# Patient Record
Sex: Male | Born: 1960
Health system: Southern US, Community
[De-identification: ages and names within clinical notes are randomized; demographics above are authoritative.]

## PROBLEM LIST (undated history)

## (undated) DIAGNOSIS — J449 Chronic obstructive pulmonary disease, unspecified: Secondary | ICD-10-CM

## (undated) DIAGNOSIS — I10 Essential (primary) hypertension: Secondary | ICD-10-CM

## (undated) DIAGNOSIS — E119 Type 2 diabetes mellitus without complications: Secondary | ICD-10-CM

## (undated) DIAGNOSIS — E785 Hyperlipidemia, unspecified: Secondary | ICD-10-CM

## (undated) DIAGNOSIS — Z720 Tobacco use: Secondary | ICD-10-CM

## (undated) DIAGNOSIS — I2510 Atherosclerotic heart disease of native coronary artery without angina pectoris: Secondary | ICD-10-CM

## (undated) HISTORY — PX: CHOLECYSTECTOMY: SHX55

## (undated) HISTORY — PX: HERNIA REPAIR: SHX51

## (undated) HISTORY — DX: Essential (primary) hypertension: I10

## (undated) HISTORY — DX: Hyperlipidemia, unspecified: E78.5

---

## 2015-08-08 ENCOUNTER — Emergency Department (HOSPITAL_COMMUNITY): Payer: Self-pay

## 2015-08-08 ENCOUNTER — Observation Stay (HOSPITAL_COMMUNITY)
Admission: EM | Admit: 2015-08-08 | Discharge: 2015-08-09 | Disposition: A | Discharge status: Home / Self Care | Payer: Self-pay | Attending: Internal Medicine | Admitting: Internal Medicine

## 2015-08-08 ENCOUNTER — Encounter (HOSPITAL_COMMUNITY): Payer: Self-pay

## 2015-08-08 DIAGNOSIS — J449 Chronic obstructive pulmonary disease, unspecified: Secondary | ICD-10-CM

## 2015-08-08 DIAGNOSIS — R1031 Right lower quadrant pain: Secondary | ICD-10-CM | POA: Diagnosis present

## 2015-08-08 DIAGNOSIS — E119 Type 2 diabetes mellitus without complications: Secondary | ICD-10-CM

## 2015-08-08 DIAGNOSIS — Z7982 Long term (current) use of aspirin: Secondary | ICD-10-CM | POA: Insufficient documentation

## 2015-08-08 DIAGNOSIS — R339 Retention of urine, unspecified: Secondary | ICD-10-CM | POA: Diagnosis present

## 2015-08-08 DIAGNOSIS — Z79899 Other long term (current) drug therapy: Secondary | ICD-10-CM | POA: Insufficient documentation

## 2015-08-08 DIAGNOSIS — K5900 Constipation, unspecified: Secondary | ICD-10-CM | POA: Diagnosis present

## 2015-08-08 DIAGNOSIS — K5909 Other constipation: Secondary | ICD-10-CM

## 2015-08-08 DIAGNOSIS — R55 Syncope and collapse: Secondary | ICD-10-CM

## 2015-08-08 DIAGNOSIS — R0789 Other chest pain: Principal | ICD-10-CM | POA: Insufficient documentation

## 2015-08-08 DIAGNOSIS — I25118 Atherosclerotic heart disease of native coronary artery with other forms of angina pectoris: Secondary | ICD-10-CM

## 2015-08-08 DIAGNOSIS — R079 Chest pain, unspecified: Secondary | ICD-10-CM

## 2015-08-08 DIAGNOSIS — I2510 Atherosclerotic heart disease of native coronary artery without angina pectoris: Secondary | ICD-10-CM | POA: Diagnosis present

## 2015-08-08 DIAGNOSIS — F17210 Nicotine dependence, cigarettes, uncomplicated: Secondary | ICD-10-CM | POA: Insufficient documentation

## 2015-08-08 DIAGNOSIS — Z7984 Long term (current) use of oral hypoglycemic drugs: Secondary | ICD-10-CM | POA: Insufficient documentation

## 2015-08-08 HISTORY — DX: Atherosclerotic heart disease of native coronary artery without angina pectoris: I25.10

## 2015-08-08 HISTORY — DX: Chronic obstructive pulmonary disease, unspecified: J44.9

## 2015-08-08 HISTORY — DX: Type 2 diabetes mellitus without complications: E11.9

## 2015-08-08 LAB — COMPREHENSIVE METABOLIC PANEL
ALBUMIN: 4.4 g/dL (ref 3.5–5.0)
ALT: 20 U/L (ref 17–63)
ANION GAP: 12 (ref 5–15)
AST: 23 U/L (ref 15–41)
Alkaline Phosphatase: 86 U/L (ref 38–126)
BUN: 14 mg/dL (ref 6–20)
CHLORIDE: 101 mmol/L (ref 101–111)
CO2: 25 mmol/L (ref 22–32)
Calcium: 9 mg/dL (ref 8.9–10.3)
Creatinine, Ser: 0.93 mg/dL (ref 0.61–1.24)
GFR calc Af Amer: >60 mL/min (ref >60)
GFR calc non Af Amer: >60 mL/min (ref >60)
GLUCOSE: 162 mg/dL — AB (ref 65–99)
POTASSIUM: 3 mmol/L — AB (ref 3.5–5.1)
SODIUM: 138 mmol/L (ref 135–145)
Total Bilirubin: 0.8 mg/dL (ref 0.3–1.2)
Total Protein: 8.2 g/dL — ABNORMAL HIGH (ref 6.5–8.1)

## 2015-08-08 LAB — CBC WITH DIFFERENTIAL/PLATELET
BASOS PCT: 0 %
Basophils Absolute: 0 10*3/uL (ref 0.0–0.1)
EOS ABS: 0.1 10*3/uL (ref 0.0–0.7)
EOS PCT: 0 %
HCT: 44 % (ref 39.0–52.0)
HEMOGLOBIN: 16 g/dL (ref 13.0–17.0)
LYMPHS ABS: 3.5 10*3/uL (ref 0.7–4.0)
Lymphocytes Relative: 19 %
MCH: 31.4 pg (ref 26.0–34.0)
MCHC: 36.4 g/dL — AB (ref 30.0–36.0)
MCV: 86.4 fL (ref 78.0–100.0)
MONOS PCT: 5 %
Monocytes Absolute: 0.9 10*3/uL (ref 0.1–1.0)
NEUTROS PCT: 76 %
Neutro Abs: 14.2 10*3/uL — ABNORMAL HIGH (ref 1.7–7.7)
PLATELETS: 292 10*3/uL (ref 150–400)
RBC: 5.09 MIL/uL (ref 4.22–5.81)
RDW: 13.4 % (ref 11.5–15.5)
WBC: 18.7 10*3/uL — AB (ref 4.0–10.5)

## 2015-08-08 LAB — I-STAT TROPONIN, ED: TROPONIN I, POC: 0 ng/mL (ref 0.00–0.08)

## 2015-08-08 NOTE — ED Provider Notes (Signed)
By signing my name below, I, Marisue Humble, attest that this documentation has been prepared under the direction and in the presence of Hildagarde Holleran N Selim Durden, DO . Electronically Signed: Marisue Humble, Scribe. 08/09/2015. 12:12 AM.  TIME SEEN: 12:05 AM  CHIEF COMPLAINT: Chest pain  HPI: HPI Comments:  Cesar Morales is a 55 y.o. male with PMHx of CAD, DM, and COPD who presents to the Emergency Department complaining of tight chest pain with syncopal episode onset ~900 yesterday morning at work. He states his chest is still tight but is moderately alleviated after taking nitroglycerin. Pt states he has had chest pain ~1-2 times per week for the past 5 years so he didn't come in immediately. His last episode of chest pain was three days ago; last syncopal episode was 6 months.  States syncopal event occurred prior to taking nitroglycerin. States and diaphoresis with these episodes.  He usually takes Imdur 30 and baby aspirin every morning for chest pain.   Pt reports associated groin pain radiating to rectum, lower abdominal pain and constipation. States that he thought that this will not was causing his chest pain this afternoon/evening. He notes his last bowel movement was 4 days ago. Pt states he drank a bottle of magnesium citrate and had suppository today with no relief. Pt reports umbilical hernia repair and cholecystectomy. Pt denies nausea, vomiting, or diarrhea. No sick contacts or recent travel. Pain is described as severe.   ROS: See HPI Constitutional: no fever  Eyes: no drainage  ENT: no runny nose   Cardiovascular:  chest pain  Resp: SOB  GI: no vomiting GU: no dysuria Integumentary: no rash  Allergy: no hives  Musculoskeletal: no leg swelling  Neurological: no slurred speech ROS otherwise negative  PAST MEDICAL HISTORY/PAST SURGICAL HISTORY:  Past Medical History  Diagnosis Date  . Coronary artery disease   . Diabetes mellitus without complication (HCC)   . COPD (chronic  obstructive pulmonary disease) (HCC)     MEDICATIONS:  Prior to Admission medications   Medication Sig Start Date End Date Taking? Authorizing Provider  aspirin EC 81 MG tablet Take 81 mg by mouth daily.   Yes Historical Provider, MD  atorvastatin (LIPITOR) 10 MG tablet Take 10 mg by mouth daily.   Yes Historical Provider, MD  chlorthalidone (HYGROTON) 25 MG tablet Take 25 mg by mouth daily.   Yes Historical Provider, MD  gabapentin (NEURONTIN) 100 MG capsule Take 100 mg by mouth at bedtime.   Yes Historical Provider, MD  glipiZIDE (GLUCOTROL) 5 MG tablet Take by mouth daily before breakfast.   Yes Historical Provider, MD  isosorbide mononitrate (IMDUR) 30 MG 24 hr tablet Take 30 mg by mouth daily.   Yes Historical Provider, MD  metFORMIN (GLUCOPHAGE) 500 MG tablet Take by mouth at bedtime.   Yes Historical Provider, MD    ALLERGIES:  Allergies  Allergen Reactions  . Lisinopril Swelling    SOCIAL HISTORY:  Social History  Substance Use Topics  . Smoking status: Current Every Day Smoker  . Smokeless tobacco: Not on file  . Alcohol Use: No    FAMILY HISTORY: No family history on file.  EXAM: BP 136/80 mmHg  Pulse 72  Temp(Src) 97.7 F (36.5 C) (Oral)  Resp 13  Ht 6' (1.829 m)  Wt 272 lb (123.378 kg)  BMI 36.88 kg/m2  SpO2 95% CONSTITUTIONAL: Alert and oriented and responds appropriately to questions. Chronically ill appearing. Appears uncomfortable.  HEAD: Normocephalic EYES: Conjunctivae clear, PERRL ENT: normal nose;  no rhinorrhea; moist mucous membranes NECK: Supple, no meningismus, no LAD  CARD: RRR; S1 and S2 appreciated; no murmurs, no clicks, no rubs, no gallops RESP: Normal chest excursion without splinting or tachypnea; breath sounds clear and equal bilaterally; no wheezes, no rhonchi, no rales, no hypoxia or respiratory distress, speaking full sentences ABD/GI: Normal bowel sounds; non-distended; soft, TTP suprapubic area, tenderness at McBurney's pt and  involuntary guarding; no rebound, no peritoneal signs;  RECTAL:  Normal rectal tone, no gross blood or melena, no hemorrhoids appreciated, nontender rectal exam; large amount of hard stool in the rectal vault, patient unable to tolerate disimpaction BACK:  The back appears normal and is non-tender to palpation, there is no CVA tenderness EXT: Normal ROM in all joints; non-tender to palpation; no edema; normal capillary refill; no cyanosis, no calf tenderness or swelling    SKIN: Normal color for age and race; warm; no rash NEURO: Moves all extremities equally, sensation to light touch intact diffusely, cranial nerves II through XII intact PSYCH: The patient's mood and manner are appropriate. Grooming and personal hygiene are appropriate.  MEDICAL DECISION MAKING: Patient here with complaints of chest pain and syncopal event. Reports recent cardiac catheterization in October 2016 in [place]. We'll attempt to obtain these records. We'll give aspirin, nitroglycerin. Will obtain cardiac labs, chest x-ray. Concern for ACS. PE and dissection also on differential but less likely.   As for patient's abdominal pain, concern for possible appendicitis, small bowel obstruction, colitis. We'll obtain CT of his abdomen and pelvis. Will obtain abdominal labs, urine. Will give IV fluids, morphine.  ED PROGRESS: Patient's labs show leukocytosis of 18.7 with left shift. Potassium slightly low at 3.0 which we have replaced. Troponin is negative. Urine shows no sign of infection. Chest x-ray clear.    2:00 AM  Pt's chest pain is now gone. Still having lower abdominal pain. We'll give second dose of IV morphine. Reports he was able to have a large bowel movement after Fleet enema but this did not improve his pain.  CTAP pending.     2:30 AM  D/w Dr. Onalee Hua with hospitalist service.  She agrees with admission for chest pain rule out, syncope workup. Will admit to telemetry, observation. Patient comfortable  with this plan.    Pt's outside hospital records show pain coronary arteries with normal left ventricular systolic function and normal left ventricular end-diastolic pressures. Heart catheterization was performed September 2016. They did not send an EKG or echocardiogram.   3:15 AM  Pt had another brief episode of chest pain. Repeat EKG is unremarkable. Chest pain-free after nitroglycerin.   EKG Interpretation  Date/Time:  Wednesday August 08 2015 23:02:34 EDT Ventricular Rate:  82 PR Interval:  174 QRS Duration: 111 QT Interval:  398 QTC Calculation: 465 R Axis:   -21 Text Interpretation:  Sinus rhythm Borderline left axis deviation Borderline T wave abnormalities Baseline wander in lead(s) V3 V4 No old tracing to compare Confirmed by Deserea Bordley,  DO, Hanaa Payes [phone]) on 08/08/2015 11:52:27 PM          EKG Interpretation  Date/Time:  Thursday August 09 2015 03:12:49 EDT Ventricular Rate:  73 PR Interval:  182 QRS Duration: 100 QT Interval:  424 QTC Calculation: 467 R Axis:   -32 Text Interpretation:  Sinus rhythm Left axis deviation Abnormal R-wave progression, late transition Borderline T wave abnormalities No significant change since last tracing Confirmed by Tanasia Budzinski,  DO, Exavier Lina ([id number]) on 08/09/2015 3:31:47 AM  I personally performed the services described in this documentation, which was scribed in my presence. The recorded information has been reviewed and is accurate.    Layla Maw Armida Vickroy, DO 08/09/15 [phone]

## 2015-08-08 NOTE — ED Notes (Addendum)
Pt states he frequently has chest pain and takes nitroglycerin that relieves the pain. Reports chest pain started this morning at work, where he "passed out", denies hitting his head. Pt took nitro with relief this AM and CP started again today, Nitro did not relieve it. States pain to groin that radiates into rectum. Has not had BM in 1 week. Reports he was unable to urinate since last night, pt has since voided of amber urine.

## 2015-08-08 NOTE — ED Notes (Signed)
Pt reports chest tightness that started earlier in the day, states he has also been sob.  Pt states he also recently has pain to his lower abd/groin.  Pt took own nitro approx 2 hours ago.

## 2015-08-09 ENCOUNTER — Emergency Department (HOSPITAL_COMMUNITY): Payer: Self-pay

## 2015-08-09 ENCOUNTER — Observation Stay (HOSPITAL_BASED_OUTPATIENT_CLINIC_OR_DEPARTMENT_OTHER): Payer: MEDICAID

## 2015-08-09 ENCOUNTER — Observation Stay (HOSPITAL_COMMUNITY): Payer: Self-pay

## 2015-08-09 ENCOUNTER — Encounter (HOSPITAL_COMMUNITY): Payer: Self-pay

## 2015-08-09 DIAGNOSIS — R079 Chest pain, unspecified: Secondary | ICD-10-CM | POA: Diagnosis present

## 2015-08-09 DIAGNOSIS — J449 Chronic obstructive pulmonary disease, unspecified: Secondary | ICD-10-CM | POA: Diagnosis present

## 2015-08-09 DIAGNOSIS — R339 Retention of urine, unspecified: Secondary | ICD-10-CM | POA: Diagnosis present

## 2015-08-09 DIAGNOSIS — K5900 Constipation, unspecified: Secondary | ICD-10-CM | POA: Diagnosis present

## 2015-08-09 DIAGNOSIS — I2510 Atherosclerotic heart disease of native coronary artery without angina pectoris: Secondary | ICD-10-CM | POA: Diagnosis present

## 2015-08-09 DIAGNOSIS — R55 Syncope and collapse: Secondary | ICD-10-CM | POA: Diagnosis present

## 2015-08-09 DIAGNOSIS — R1031 Right lower quadrant pain: Secondary | ICD-10-CM | POA: Diagnosis present

## 2015-08-09 DIAGNOSIS — E119 Type 2 diabetes mellitus without complications: Secondary | ICD-10-CM

## 2015-08-09 LAB — URINALYSIS, ROUTINE W REFLEX MICROSCOPIC
GLUCOSE, UA: NEGATIVE mg/dL
HGB URINE DIPSTICK: NEGATIVE
Ketones, ur: NEGATIVE mg/dL
Leukocytes, UA: NEGATIVE
Nitrite: NEGATIVE
PH: 6.5 (ref 5.0–8.0)
Protein, ur: NEGATIVE mg/dL
SPECIFIC GRAVITY, URINE: 1.015 (ref 1.005–1.030)

## 2015-08-09 LAB — GLUCOSE, CAPILLARY
GLUCOSE-CAPILLARY: 135 mg/dL — AB (ref 65–99)
GLUCOSE-CAPILLARY: 164 mg/dL — AB (ref 65–99)
Glucose-Capillary: 124 mg/dL — ABNORMAL HIGH (ref 65–99)

## 2015-08-09 LAB — TROPONIN I
Troponin I: <0.03 ng/mL (ref <0.031)
Troponin I: <0.03 ng/mL (ref <0.031)
Troponin I: <0.03 ng/mL (ref <0.031)

## 2015-08-09 LAB — BRAIN NATRIURETIC PEPTIDE: B Natriuretic Peptide: 25 pg/mL (ref 0.0–100.0)

## 2015-08-09 MED ORDER — PANTOPRAZOLE SODIUM 40 MG PO TBEC: 40.0000 mg | DELAYED_RELEASE_TABLET | Freq: Every day | ORAL | Status: DC

## 2015-08-09 MED ORDER — ASPIRIN 81 MG PO CHEW
324.0000 mg | CHEWABLE_TABLET | Freq: Once | ORAL | Status: AC
  Administered 2015-08-09: 324 mg via ORAL
  Filled 2015-08-09: qty 4

## 2015-08-09 MED ORDER — SODIUM CHLORIDE 0.9 % IV SOLN
INTRAVENOUS | Status: DC
  Administered 2015-08-09: 01:00:00 via INTRAVENOUS

## 2015-08-09 MED ORDER — POTASSIUM CHLORIDE CRYS ER 20 MEQ PO TBCR
40.0000 meq | EXTENDED_RELEASE_TABLET | Freq: Once | ORAL | Status: AC
  Administered 2015-08-09: 40 meq via ORAL
  Filled 2015-08-09: qty 2

## 2015-08-09 MED ORDER — ISOSORBIDE MONONITRATE ER 60 MG PO TB24
30.0000 mg | ORAL_TABLET | Freq: Every day | ORAL | Status: DC
  Administered 2015-08-09: 30 mg via ORAL
  Filled 2015-08-09: qty 1

## 2015-08-09 MED ORDER — ACETAMINOPHEN 325 MG PO TABS: 650.0000 mg | ORAL_TABLET | ORAL | Status: DC | PRN

## 2015-08-09 MED ORDER — IOHEXOL 300 MG/ML  SOLN
100.0000 mL | Freq: Once | INTRAMUSCULAR | Status: AC | PRN
  Administered 2015-08-09: 100 mL via INTRAVENOUS

## 2015-08-09 MED ORDER — FLEET ENEMA 7-19 GM/118ML RE ENEM
1.0000 | ENEMA | Freq: Once | RECTAL | Status: AC
  Administered 2015-08-09: 1 via RECTAL

## 2015-08-09 MED ORDER — TRAZODONE HCL 100 MG PO TABS: 100.0000 mg | ORAL_TABLET | Freq: Every day | ORAL | Status: DC

## 2015-08-09 MED ORDER — ATORVASTATIN CALCIUM 10 MG PO TABS
10.0000 mg | ORAL_TABLET | Freq: Every day | ORAL | Status: DC
  Administered 2015-08-09: 10 mg via ORAL
  Filled 2015-08-09: qty 1

## 2015-08-09 MED ORDER — MORPHINE SULFATE (PF) 4 MG/ML IV SOLN
4.0000 mg | Freq: Once | INTRAVENOUS | Status: AC
  Administered 2015-08-09: 4 mg via INTRAVENOUS
  Filled 2015-08-09: qty 1

## 2015-08-09 MED ORDER — ASPIRIN EC 81 MG PO TBEC
81.0000 mg | DELAYED_RELEASE_TABLET | Freq: Every day | ORAL | Status: DC
  Administered 2015-08-09: 81 mg via ORAL
  Filled 2015-08-09: qty 1

## 2015-08-09 MED ORDER — TAMSULOSIN HCL 0.4 MG PO CAPS
0.4000 mg | ORAL_CAPSULE | Freq: Every day | ORAL | Status: DC
  Administered 2015-08-09: 0.4 mg via ORAL
  Filled 2015-08-09: qty 1

## 2015-08-09 MED ORDER — ONDANSETRON HCL 4 MG/2ML IJ SOLN
4.0000 mg | Freq: Once | INTRAMUSCULAR | Status: AC
  Administered 2015-08-09: 4 mg via INTRAVENOUS
  Filled 2015-08-09: qty 2

## 2015-08-09 MED ORDER — TAMSULOSIN HCL 0.4 MG PO CAPS: 0.4000 mg | ORAL_CAPSULE | Freq: Every day | ORAL | Status: DC

## 2015-08-09 MED ORDER — MORPHINE SULFATE (PF) 2 MG/ML IV SOLN
2.0000 mg | INTRAVENOUS | Status: DC | PRN
Start: 2015-08-09 — End: 2015-08-09

## 2015-08-09 MED ORDER — ONDANSETRON HCL 4 MG/2ML IJ SOLN: 4.0000 mg | Freq: Four times a day (QID) | INTRAMUSCULAR | Status: DC | PRN

## 2015-08-09 MED ORDER — NITROGLYCERIN 0.4 MG SL SUBL
0.4000 mg | SUBLINGUAL_TABLET | SUBLINGUAL | Status: DC | PRN
Start: 2015-08-09 — End: 2015-08-09
  Administered 2015-08-09 (×2): 0.4 mg via SUBLINGUAL
  Filled 2015-08-09 (×2): qty 1

## 2015-08-09 NOTE — Discharge Summary (Signed)
Physician Discharge Summary  Cesar Morales KGY:[id number] DOB: 1961-05-15 DOA: 08/08/2015  PCP: No primary care provider on file.  Admit date: 08/08/2015 Discharge date: 08/09/2015  Time spent: 45 minutes  Recommendations for Outpatient Follow-up:  -Will be discharged home today. -Follow up with PCP has been arranged by case management.   Discharge Diagnoses:  Principal Problem:   Chest pain Active Problems:   COPD (chronic obstructive pulmonary disease) (HCC)   Diabetes mellitus without complication (HCC)   Coronary artery disease   Abdominal pain, acute, right lower quadrant   Syncope   Urinary retention   Constipation   Chronic obstructive pulmonary disease (HCC)   Faintness   Discharge Condition: Stable and improved  Filed Weights   08/08/15 2305 08/09/15 0534  Weight: 123.378 kg (272 lb) 116.484 kg (256 lb 12.8 oz)    History of present illness:  As per Dr. 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 Pt reports he has been having 3 days of constipation (unusual for him ) and 24 hours of difficulty in urinating. Pt says he was at work and started to have this pressure like sensation in his suprapubic region which was very uncomfortable he started to get dizzy. He went to sit on a 5 gallon bucket to lean over due to the pain, and he passed out. His friend at work laid him down, and when he awoke he was having some substernal chest pain, for which he takes ntg pills for . His friend then gave him a ntg pill which relieved his chest pain. Pt abdominal pain went away. He went home. Later in the evening, the same thing happened, he started with lower abdominal /suprapubic abdomional pain then chest pain but did not pass out this time. Pt denies any dysuria but difficulty in urination. Has never had an issue with urinating before or with constipation. Denies fevers. He was  given an enema in the ED which resulting in several bowel movements and pt reports he feels much better. Pt had a cardiac cath at outside facility in [place] end of 2016 reported to me by dr ward as no significant CAD. Pt reports he was told after that cath that he had "small arteries" and it was "hereditary" and that he has "spasms". Pt denies any penile discharge or rashes. No bulging in his inguinal area.  Hospital Course:   Chest pain -Has ruled out for ACS with negative enzymes and EKG without acute ischemic abnormalities. -Was able to track down cath report from [facility] dated September 2016. He had clean coronary arteries at that time and an ejection fraction of 60%. -Given negative calf less than 6 months ago, feel safe discharging him home today without any further cardiac workup. -2-D echo has been performed today with results pending at time of discharge. -He does have a history of GERD and has been taking ranitidine, will change over to Protonix.  Rest of chronic conditions are stable.  Procedures:  None   Consultations:  None  Discharge Instructions  Discharge Instructions    Diet - low sodium heart healthy    Complete by:  As directed      Increase activity slowly    Complete by:  As directed             Medication List    STOP taking these medications  ranitidine 150 MG tablet  Commonly known as:  ZANTAC      TAKE these medications        albuterol 108 (90 Base) MCG/ACT inhaler  Commonly known as:  PROVENTIL HFA;VENTOLIN HFA  Inhale 2 puffs into the lungs every 6 (six) hours as needed for wheezing or shortness of breath.     aspirin EC 81 MG tablet  Take 81 mg by mouth daily.     atorvastatin 10 MG tablet  Commonly known as:  LIPITOR  Take 10 mg by mouth daily.     chlorthalidone 25 MG tablet  Commonly known as:  HYGROTON  Take 25 mg by mouth daily.     gabapentin 100 MG capsule  Commonly known as:  NEURONTIN  Take 100 mg by  mouth at bedtime.     glipiZIDE 5 MG tablet  Commonly known as:  GLUCOTROL  Take by mouth daily before breakfast.     ibuprofen 200 MG tablet  Commonly known as:  ADVIL,MOTRIN  Take 600 mg by mouth daily as needed for moderate pain.     isosorbide mononitrate 30 MG 24 hr tablet  Commonly known as:  IMDUR  Take 30 mg by mouth daily.     metFORMIN 500 MG tablet  Commonly known as:  GLUCOPHAGE  Take 500 mg by mouth 2 (two) times daily with a meal.     naproxen 500 MG tablet  Commonly known as:  NAPROSYN  Take 500 mg by mouth daily as needed for moderate pain.     pantoprazole 40 MG tablet  Commonly known as:  PROTONIX  Take 1 tablet (40 mg total) by mouth daily.     tamsulosin 0.4 MG Caps capsule  Commonly known as:  FLOMAX  Take 1 capsule (0.4 mg total) by mouth daily.     traZODone 100 MG tablet  Commonly known as:  DESYREL  Take 1 tablet (100 mg total) by mouth at bedtime.       Allergies  Allergen Reactions  . Lisinopril Swelling       Follow-up Information    Follow up with Rondel Baton On 08/21/2015.   Why:  @ 9am bring application and listed documents   Contact information:   [address] [place] [address] [phone]        The results of significant diagnostics from this hospitalization (including imaging, microbiology, ancillary and laboratory) are listed below for reference.    Significant Diagnostic Studies: Ct Abdomen Pelvis W Contrast  08/09/2015  CLINICAL DATA:  55 year old male with right lower quadrant abdominal pain, constipation, and leukocytosis. EXAM: CT ABDOMEN AND PELVIS WITH CONTRAST TECHNIQUE: Multidetector CT imaging of the abdomen and pelvis was performed using the standard protocol following bolus administration of intravenous contrast. CONTRAST:  OMNIPAQUE IOHEXOL 300 MG/ML  SOLN COMPARISON:  None. FINDINGS: The visualized lung bases are clear. No intra-abdominal free air or free fluid. Cholecystectomy. The  liver, pancreas, spleen, adrenal glands, kidneys, visualized ureters, and urinary bladder appear unremarkable. The prostate and seminal vesicles are grossly unremarkable. Loose stool noted throughout the colon. There is no evidence of bowel obstruction or inflammation. Normal appendix. The abdominal aorta and IVC appear unremarkable. Retrograde left renal vein. No portal venous gas identified. There is no adenopathy. The abdominal wall soft tissues appear unremarkable. A subcentimeter faint sclerotic nodule in the left iliac bone posteriorly is indeterminate but may represent a bone island. IMPRESSION: Loose stool within the proximal colon. Correlation with  clinical exam and stool cultures recommended. No evidence of bowel obstruction or inflammation. Normal appendix. Electronically Signed   By: Elgie Collard M.D.   On: 08/09/2015 01:33   Dg Chest Portable 1 View  08/08/2015  CLINICAL DATA:  Central chest pain and tightness beginning several hours ago. Chest pain for 3 years but this is more severe. Smoker. EXAM: PORTABLE CHEST 1 VIEW COMPARISON:  None. FINDINGS: The heart size and mediastinal contours are within normal limits. Both lungs are clear. The visualized skeletal structures are unremarkable. IMPRESSION: No active disease. Electronically Signed   By: Burman Nieves M.D.   On: 08/08/2015 23:43    Microbiology: No results found for this or any previous visit (from the past 240 hour(s)).   Labs: Basic Metabolic Panel:  Recent Labs Lab 08/08/15 2302  NA 138  K 3.0*  CL 101  CO2 25  GLUCOSE 162*  BUN 14  CREATININE 0.93  CALCIUM 9.0   Liver Function Tests:  Recent Labs Lab 08/08/15 2302  AST 23  ALT 20  ALKPHOS 86  BILITOT 0.8  PROT 8.2*  ALBUMIN 4.4   No results for input(s): LIPASE, AMYLASE in the last 168 hours. No results for input(s): AMMONIA in the last 168 hours. CBC:  Recent Labs Lab 08/08/15 2302  WBC 18.7*  NEUTROABS 14.2*  HGB 16.0  HCT 44.0  MCV  86.4  PLT 292   Cardiac Enzymes:  Recent Labs Lab 08/09/15 0448 08/09/15 1045 08/09/15 1618  TROPONINI <0.03 <0.03 <0.03   BNP: BNP (last 3 results)  Recent Labs  08/08/15 2302  BNP 25.0    ProBNP (last 3 results) No results for input(s): PROBNP in the last 8760 hours.  CBG:  Recent Labs Lab 08/09/15 0743 08/09/15 1122 08/09/15 1616  GLUCAP 124* 135* 164*       Signed:  HERNANDEZ ACOSTA,ESTELA  [facility] Pager: [phone] 08/09/2015, 5:15 PM

## 2015-08-09 NOTE — H&P (Signed)
PCP:   No primary care provider on file.   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  Pt reports he has been having 3 days of constipation (unusual for him ) and 24 hours of difficulty in urinating.  Pt says he was at work and started to have this pressure like sensation in his suprapubic region which was very uncomfortable he started to get dizzy.  He went to sit on a 5 gallon bucket to lean over due to the pain, and he passed out.  His friend at work laid him down, and when he awoke he was having some substernal chest pain, for which he takes ntg pills for .  His friend then gave him a ntg pill which relieved his chest pain.  Pt abdominal pain went away.  He went home.  Later in the evening, the same thing happened, he started with lower abdominal /suprapubic abdomional pain then chest pain but did not pass out this time.  Pt denies any dysuria but difficulty in urination. Has never had an issue with urinating before or with constipation.  Denies fevers.  He was given an enema in the ED which resulting in several bowel movements and pt reports he feels much better.  Pt had a cardiac cath at outside facility in [place] end of 2016 reported to me by dr ward as no significant CAD.  Pt reports he was told after that cath that he had "small arteries" and it was "hereditary" and that he has "spasms".  Pt denies any penile discharge or rashes.  No bulging in his inguinal area.   Review of Systems:  Positive and negative as per HPI otherwise all other systems are negative  Past Medical History: Past Medical History  Diagnosis Date  . Coronary artery disease   . Diabetes mellitus without complication (HCC)   . COPD (chronic obstructive pulmonary disease) [facility])    Past Surgical History  Procedure Laterality Date  . Cholecystectomy    .  Hernia repair      Medications: Prior to Admission medications   Medication Sig Start Date End Date Taking? Authorizing Provider  aspirin EC 81 MG tablet Take 81 mg by mouth daily.   Yes Historical Provider, MD  atorvastatin (LIPITOR) 10 MG tablet Take 10 mg by mouth daily.   Yes Historical Provider, MD  chlorthalidone (HYGROTON) 25 MG tablet Take 25 mg by mouth daily.   Yes Historical Provider, MD  gabapentin (NEURONTIN) 100 MG capsule Take 100 mg by mouth at bedtime.   Yes Historical Provider, MD  glipiZIDE (GLUCOTROL) 5 MG tablet Take by mouth daily before breakfast.   Yes Historical Provider, MD  isosorbide mononitrate (IMDUR) 30 MG 24 hr tablet Take 30 mg by mouth daily.   Yes Historical Provider, MD  metFORMIN (GLUCOPHAGE) 500 MG tablet Take by mouth at bedtime.   Yes Historical Provider, MD    Allergies:   Allergies  Allergen Reactions  . Lisinopril Swelling    Social History:  reports that he has been smoking.  He does not have any smokeless tobacco history on file. He reports that he does not drink alcohol or use illicit drugs.  Family History: No premature CAD  Physical Exam: Filed Vitals:   08/09/15 0230 08/09/15 0300 08/09/15 0330 08/09/15 0400  BP: 127/83 131/71 118/72 129/77  Pulse: 63  63 62 61  Temp:      TempSrc:      Resp: 14 16 13 13   Height:      Weight:      SpO2: 97% 98% 95% 97%   General appearance: alert, cooperative and no distress Head: Normocephalic, without obvious abnormality, atraumatic Eyes: negative Nose: Nares normal. Septum midline. Mucosa normal. No drainage or sinus tenderness. Neck: no JVD and supple, symmetrical, trachea midline Lungs: clear to auscultation bilaterally Heart: regular rate and rhythm, S1, S2 normal, no murmur, click, rub or gallop Abdomen: soft, ttp blq and suprapubic area, good bs.  no r/g ND Male genitalia: normal  No hernias.  External exam is normal.  No discharge. Extremities: extremities normal, atraumatic, no  cyanosis or edema Pulses: 2+ and symmetric Skin: Skin color, texture, turgor normal. No rashes or lesions Neurologic: Grossly normal  Labs on Admission:   Recent Labs  08/08/15 2302  NA 138  K 3.0*  CL 101  CO2 25  GLUCOSE 162*  BUN 14  CREATININE 0.93  CALCIUM 9.0    Recent Labs  08/08/15 2302  AST 23  ALT 20  ALKPHOS 86  BILITOT 0.8  PROT 8.2*  ALBUMIN 4.4     Recent Labs  08/08/15 2302  WBC 18.7*  NEUTROABS 14.2*  HGB 16.0  HCT 44.0  MCV 86.4  PLT 292    Radiological Exams on Admission: Ct Abdomen Pelvis W Contrast  08/09/2015  CLINICAL DATA:  55 year old male with right lower quadrant abdominal pain, constipation, and leukocytosis. EXAM: CT ABDOMEN AND PELVIS WITH CONTRAST TECHNIQUE: Multidetector CT imaging of the abdomen and pelvis was performed using the standard protocol following bolus administration of intravenous contrast. CONTRAST:  44 OMNIPAQUE IOHEXOL 300 MG/ML  SOLN COMPARISON:  None. FINDINGS: The visualized lung bases are clear. No intra-abdominal free air or free fluid. Cholecystectomy. The liver, pancreas, spleen, adrenal glands, kidneys, visualized ureters, and urinary bladder appear unremarkable. The prostate and seminal vesicles are grossly unremarkable. Loose stool noted throughout the colon. There is no evidence of bowel obstruction or inflammation. Normal appendix. The abdominal aorta and IVC appear unremarkable. Retrograde left renal vein. No portal venous gas identified. There is no adenopathy. The abdominal wall soft tissues appear unremarkable. A subcentimeter faint sclerotic nodule in the left iliac bone posteriorly is indeterminate but may represent a bone island. IMPRESSION: Loose stool within the proximal colon. Correlation with clinical exam and stool cultures recommended. No evidence of bowel obstruction or inflammation. Normal appendix. Electronically Signed   By: M.D.   On: 08/09/2015 01:33   Dg Chest Portable 1  View  08/08/2015  CLINICAL DATA:  Central chest pain and tightness beginning several hours ago. Chest pain for 3 years but this is more severe. Smoker. EXAM: PORTABLE CHEST 1 VIEW COMPARISON:  None. FINDINGS: The heart size and mediastinal contours are within normal limits. Both lungs are clear. The visualized skeletal structures are unremarkable. IMPRESSION: No active disease. Electronically Signed   By: 08/10/2015 M.D.   On: 08/08/2015 23:43    Assessment/Plan  55 yo male with abdominal pain, constipation, urinary retention, and chest pain  Principal Problem:   Chest pain- sounds like prinzmetal angina.  Have asked RN on floor to get cath report which was obtained by ED, reported as being pretty normal.  This was probably brought on by his GI/GU symptoms.  Romi.  Check echo in am.  Address below issues.  Active Problems:   Abdominal pain,  acute, lower abdomen- unclear if this is from urinary retention/or constipation or both.  abd exam is benign.     Urinary retention - ua is clean.  Will check bladder scan at this time.  Could be brought on by his constipation.  Will place on flomax in the am, but see if this is relieved with relieving his constipation   Syncope- likely vasovagal response to his GU /GI issues   COPD (chronic obstructive pulmonary disease) (HCC)- stable, noted   Diabetes mellitus without complication (HCC)- stable, noted   Constipation - make sure pt has had appropriate screening colonoscopy , if not will need one as outpatient  Pt has no local PCP. Just moved here from [place].  obs on tele.  Full code.    Carden Teel A 08/09/2015, 5:19 AM

## 2015-08-09 NOTE — Care Management Note (Signed)
Case Management Note  Patient Details  Name: Cesar Morales MRN: [id number] Date of Birth: February 26, 1961  Pt is from home, lives alone and is ind with ADL's. Pt is uninsured, unemployed and has no PCP. Pt plans to return home with self care at DC. Pt referred to [facility], his f/u of choice, and given MATCH voucher for any Rx. FC has been consulted and is at bedside now. Pt's ex-wife is at the bedside to provide support. Potential for DC today.   Expected Discharge Date:    08/09/2015              Expected Discharge Plan:  Home/Self Care  In-House Referral:  Financial Counselor  Discharge planning Services  CM Consult, Follow-up appt scheduled, MATCH Program, Medication Assistance  Post Acute Care Choice:  NA Choice offered to:  NA  DME Arranged:    DME Agency:     HH Arranged:    HH Agency:     Status of Service:  Completed, signed off  Medicare Important Message Given:    Date Medicare IM Given:    Medicare IM give by:    Date Additional Medicare IM Given:    Additional Medicare Important Message give by:     If discussed at Long Length of Stay Meetings, dates discussed:    Additional Comments:  Malcolm Metro, RN 08/09/2015, 3:26 PM

## 2015-08-09 NOTE — Progress Notes (Signed)
Discharge instructions read to patient and his family.  Both verbalized understanding of all instructions.  Discharged to home with family 

## 2015-08-09 NOTE — Plan of Care (Signed)
Problem: Phase I Progression Outcomes Goal: Anginal pain relieved Outcome: Completed/Met Date Met:  08/09/15 denies

## 2015-08-09 NOTE — ED Notes (Signed)
Pt's wife, Velna Hatchet [phone]

## 2015-08-10 LAB — ECHOCARDIOGRAM COMPLETE
HEIGHTINCHES: 72 in
Weight: 4108.8 oz

## 2015-12-27 ENCOUNTER — Telehealth: Payer: Self-pay

## 2015-12-27 NOTE — Telephone Encounter (Signed)
Patients wife contacted office asking to make a new patient appt for her husband. She states that he has been having chest pain and vomiting at times. Advised wife that patient did not need to come here for those symptoms but needed to go to the ER immediately. Wife states that he has had these symptoms for quite some time and refuses to go to ER. Only wants a Drs appt. Gave appt for new patient per wife request but again advised that he should not wait on this appt and if symptoms worsen he should to to ER Asap. Wife verbalized understanding.

## 2016-01-03 ENCOUNTER — Ambulatory Visit (INDEPENDENT_AMBULATORY_CARE_PROVIDER_SITE_OTHER): Payer: 59 | Admitting: Family

## 2016-01-03 ENCOUNTER — Encounter: Payer: Self-pay | Admitting: Family

## 2016-01-03 VITALS — BP 128/88 | HR 85 | Temp 96.8°F | Ht 72.0 in | Wt 279.8 lb

## 2016-01-03 DIAGNOSIS — Z1211 Encounter for screening for malignant neoplasm of colon: Secondary | ICD-10-CM

## 2016-01-03 DIAGNOSIS — R55 Syncope and collapse: Secondary | ICD-10-CM

## 2016-01-03 DIAGNOSIS — K5900 Constipation, unspecified: Secondary | ICD-10-CM

## 2016-01-03 DIAGNOSIS — K219 Gastro-esophageal reflux disease without esophagitis: Secondary | ICD-10-CM | POA: Diagnosis not present

## 2016-01-03 DIAGNOSIS — E785 Hyperlipidemia, unspecified: Secondary | ICD-10-CM | POA: Diagnosis not present

## 2016-01-03 DIAGNOSIS — E1142 Type 2 diabetes mellitus with diabetic polyneuropathy: Secondary | ICD-10-CM | POA: Diagnosis not present

## 2016-01-03 DIAGNOSIS — R079 Chest pain, unspecified: Secondary | ICD-10-CM

## 2016-01-03 DIAGNOSIS — J449 Chronic obstructive pulmonary disease, unspecified: Secondary | ICD-10-CM | POA: Diagnosis not present

## 2016-01-03 DIAGNOSIS — G4700 Insomnia, unspecified: Secondary | ICD-10-CM

## 2016-01-03 DIAGNOSIS — E669 Obesity, unspecified: Secondary | ICD-10-CM

## 2016-01-03 DIAGNOSIS — Z1159 Encounter for screening for other viral diseases: Secondary | ICD-10-CM | POA: Diagnosis not present

## 2016-01-03 DIAGNOSIS — Z114 Encounter for screening for human immunodeficiency virus [HIV]: Secondary | ICD-10-CM

## 2016-01-03 DIAGNOSIS — E119 Type 2 diabetes mellitus without complications: Secondary | ICD-10-CM

## 2016-01-03 DIAGNOSIS — I25118 Atherosclerotic heart disease of native coronary artery with other forms of angina pectoris: Secondary | ICD-10-CM | POA: Diagnosis not present

## 2016-01-03 LAB — BAYER DCA HB A1C WAIVED: HB A1C (BAYER DCA - WAIVED): 6.3 % (ref <7.0)

## 2016-01-03 MED ORDER — ATORVASTATIN CALCIUM 10 MG PO TABS: 10.0000 mg | ORAL_TABLET | Freq: Every day | ORAL | 1 refills | Status: DC

## 2016-01-03 MED ORDER — TRAZODONE HCL 100 MG PO TABS: 100.0000 mg | ORAL_TABLET | Freq: Every day | ORAL | 2 refills | Status: DC

## 2016-01-03 MED ORDER — GABAPENTIN 300 MG PO CAPS: 300.0000 mg | ORAL_CAPSULE | Freq: Three times a day (TID) | ORAL | 3 refills | Status: DC

## 2016-01-03 MED ORDER — ISOSORBIDE MONONITRATE ER 30 MG PO TB24: 30.0000 mg | ORAL_TABLET | Freq: Every day | ORAL | 1 refills | Status: DC

## 2016-01-03 MED ORDER — GLIPIZIDE 5 MG PO TABS: 5.0000 mg | ORAL_TABLET | Freq: Every day | ORAL | 1 refills | Status: DC

## 2016-01-03 MED ORDER — METFORMIN HCL 500 MG PO TABS: 500.0000 mg | ORAL_TABLET | Freq: Two times a day (BID) | ORAL | 1 refills | Status: DC

## 2016-01-03 MED ORDER — CHLORTHALIDONE 25 MG PO TABS: 25.0000 mg | ORAL_TABLET | Freq: Every day | ORAL | 1 refills | Status: DC

## 2016-01-03 MED ORDER — PANTOPRAZOLE SODIUM 40 MG PO TBEC: 40.0000 mg | DELAYED_RELEASE_TABLET | Freq: Every day | ORAL | 2 refills | Status: DC

## 2016-01-03 NOTE — Patient Instructions (Signed)

## 2016-01-03 NOTE — Progress Notes (Signed)
Subjective:    Patient ID: Cesar Morales, male    DOB: Mar 31, 1961, 55 y.o.   MRN: [id number]  Pt presents to the office today to establish care. PT has moved from [place]. PT has CAD and hx of syncope and chest pain. Pt has not followed up with Cardiologists yet. We will do a referral today.  Diabetes  He presents for his follow-up diabetic visit. He has type 2 diabetes mellitus. His disease course has been fluctuating. Hypoglycemia symptoms include dizziness. Associated symptoms include foot paresthesias. Pertinent negatives for diabetes include no blurred vision and no visual change. There are no hypoglycemic complications. Symptoms are worsening. Diabetic complications include heart disease and peripheral neuropathy. Pertinent negatives for diabetic complications include no nephropathy. Risk factors for coronary artery disease include diabetes mellitus, dyslipidemia, family history, obesity, hypertension, male sex, tobacco exposure and sedentary lifestyle. Current diabetic treatment includes oral agent (dual therapy). He is compliant with treatment all of the time. He is following a generally healthy diet. His breakfast blood glucose range is generally 140-180 mg/dl. An ACE inhibitor/angiotensin II receptor blocker is not being taken. Eye exam is not current.  Hyperlipidemia  This is a chronic problem. The current episode started more than 1 year ago. The problem is uncontrolled. Recent lipid tests were reviewed and are high. Exacerbating diseases include obesity. Factors aggravating his hyperlipidemia include smoking. Current antihyperlipidemic treatment includes statins. The current treatment provides mild improvement of lipids. Risk factors for coronary artery disease include diabetes mellitus, dyslipidemia, family history, hypertension, male sex, obesity and a sedentary lifestyle.  Gastroesophageal Reflux  He reports no belching, no dysphagia or no heartburn. This is a chronic problem. The current episode  started more than 1 year ago. The problem occurs rarely. The problem has been resolved. The symptoms are aggravated by certain foods, lying down and smoking. He has tried a PPI for the symptoms. The treatment provided moderate relief.  Insomnia  Primary symptoms: difficulty falling asleep, frequent awakening.  The current episode started more than one year. The onset quality is gradual. The problem has been waxing and waning since onset. Past treatments include medication. The treatment provided mild relief.  Constipation  This is a chronic problem. The current episode started more than 1 year ago. The problem has been waxing and waning since onset. His stool frequency is 4 to 5 times per week.  COPD PT currently taking albuterol as needed. PT currently smoking half a pack a day. PT states he is trying to "cut back". Peripheral Neuropathy Pt is currently taking gabapentin 100 mg TID. PT states he has constant burning pain of 8-9.    Review of Systems  Eyes: Negative for blurred vision.  Gastrointestinal: Positive for constipation. Negative for dysphagia and heartburn.  Neurological: Positive for dizziness.  Psychiatric/Behavioral: The patient has insomnia.    Social History   Social History  . Marital status: Legally Separated    Spouse name: N/A  . Number of children: N/A  . Years of education: N/A   Social History Main Topics  . Smoking status: Current Every Day Smoker    Packs/day: 0.50  . Smokeless tobacco: Never Used  . Alcohol use No  . Drug use: No  . Sexual activity: Not Asked   Other Topics Concern  . None   Social History Narrative  . None    Family History  Problem Relation Age of Onset  . Heart disease Mother   . Heart disease Father   . Cancer Father  Objective:   Physical Exam  Constitutional: He is oriented to person, place, and time. He appears well-developed and well-nourished. No distress.  HENT:  Head: Normocephalic.  Right Ear: External  ear normal.  Left Ear: External ear normal.  Nose: Nose normal.  Mouth/Throat: Oropharynx is clear and moist.  Eyes: Pupils are equal, round, and reactive to light. Right eye exhibits no discharge. Left eye exhibits no discharge.  Neck: Normal range of motion. Neck supple. No thyromegaly present.  Cardiovascular: Normal rate, regular rhythm, normal heart sounds and intact distal pulses.   No murmur heard. Pulmonary/Chest: Effort normal and breath sounds normal. No respiratory distress. He has no wheezes.  Abdominal: Soft. Bowel sounds are normal. He exhibits no distension. There is no tenderness.  Musculoskeletal: Normal range of motion. He exhibits no edema or tenderness.  Neurological: He is alert and oriented to person, place, and time. He has normal reflexes. No cranial nerve deficit.  Skin: Skin is warm and dry. No rash noted. No erythema.  Psychiatric: He has a normal mood and affect. His behavior is normal. Judgment and thought content normal.  Vitals reviewed.   BP 128/88   Pulse 85   Temp (!) 96.8 F (36 C) (Oral)   Ht 6' (1.829 m)   Wt 279 lb 12.8 oz (126.9 kg)   BMI 37.95 kg/m        Assessment & Plan:  1. Coronary artery disease involving native heart with other form of angina pectoris ([place]) - CMP14+EGFR - Ambulatory referral to Cardiology - isosorbide mononitrate (IMDUR) 30 MG 24 hr tablet; Take 1 tablet (30 mg total) by mouth daily.  Dispense: 90 tablet; Refill: 1  2. Chronic obstructive pulmonary disease, unspecified COPD type ([place]) - CMP14+EGFR  3. Constipation, unspecified constipation type - CMP14+EGFR  4. Diabetes mellitus without complication (HCC) - HMC94+BSJG - Bayer DCA Hb A1c Waived - Microalbumin / creatinine urine ratio - Ambulatory referral to Ophthalmology - glipiZIDE (GLUCOTROL) 5 MG tablet; Take 1 tablet (5 mg total) by mouth daily before breakfast.  Dispense: 90 tablet; Refill: 1 - metFORMIN (GLUCOPHAGE) 500 MG tablet; Take 1 tablet (500  mg total) by mouth 2 (two) times daily with a meal.  Dispense: 180 tablet; Refill: 1  5. Hyperlipemia - CMP14+EGFR - Lipid panel - atorvastatin (LIPITOR) 10 MG tablet; Take 1 tablet (10 mg total) by mouth daily.  Dispense: 90 tablet; Refill: 1  6. Gastroesophageal reflux disease, esophagitis presence not specified - CMP14+EGFR - pantoprazole (PROTONIX) 40 MG tablet; Take 1 tablet (40 mg total) by mouth daily.  Dispense: 30 tablet; Refill: 2  7. Diabetic peripheral neuropathy (HCC) -Pt's gabapentin increased to 300 mg TID from 100 mg TID - CMP14+EGFR - gabapentin (NEURONTIN) 300 MG capsule; Take 1 capsule (300 mg total) by mouth 3 (three) times daily.  Dispense: 90 capsule; Refill: 3  8. Insomnia - CMP14+EGFR - traZODone (DESYREL) 100 MG tablet; Take 1 tablet (100 mg total) by mouth at bedtime.  Dispense: 30 tablet; Refill: 2  9. Obesity (BMI 30-39.9) - CMP14+EGFR  10. Encounter for screening for HIV - CMP14+EGFR - HIV antibody  11. Need for hepatitis C screening test - CMP14+EGFR - Hepatitis C antibody  12. Colon cancer screening - CMP14+EGFR - Ambulatory referral to Gastroenterology  13. Chest pain, unspecified chest pain type - CMP14+EGFR - Ambulatory referral to Cardiology  14. Syncope, unspecified syncope type - CMP14+EGFR - Ambulatory referral to Cardiology   Continue all meds Labs pending Health Maintenance reviewed Diet and  exercise encouraged RTO 3 months  Evelina Dun, FNP

## 2016-01-04 ENCOUNTER — Other Ambulatory Visit: Payer: Self-pay | Admitting: Family

## 2016-01-04 LAB — CMP14+EGFR
ALBUMIN: 4.4 g/dL (ref 3.5–5.5)
ALT: 23 IU/L (ref 0–44)
AST: 18 IU/L (ref 0–40)
Albumin/Globulin Ratio: 1.5 (ref 1.2–2.2)
Alkaline Phosphatase: 77 IU/L (ref 39–117)
BILIRUBIN TOTAL: 0.3 mg/dL (ref 0.0–1.2)
BUN / CREAT RATIO: 11 (ref 9–20)
BUN: 13 mg/dL (ref 6–24)
CO2: 24 mmol/L (ref 18–29)
CREATININE: 1.14 mg/dL (ref 0.76–1.27)
Calcium: 9.4 mg/dL (ref 8.7–10.2)
Chloride: 97 mmol/L (ref 96–106)
GFR calc non Af Amer: 72 mL/min/{1.73_m2} (ref >59)
GFR, EST AFRICAN AMERICAN: 83 mL/min/{1.73_m2} (ref >59)
Globulin, Total: 2.9 g/dL (ref 1.5–4.5)
Glucose: 153 mg/dL — ABNORMAL HIGH (ref 65–99)
Potassium: 4.1 mmol/L (ref 3.5–5.2)
Sodium: 139 mmol/L (ref 134–144)
TOTAL PROTEIN: 7.3 g/dL (ref 6.0–8.5)

## 2016-01-04 LAB — MICROALBUMIN / CREATININE URINE RATIO
CREATININE, UR: 124.2 mg/dL
MICROALB/CREAT RATIO: 2.9 mg/g creat (ref 0.0–30.0)
MICROALBUM., U, RANDOM: 3.6 ug/mL

## 2016-01-04 LAB — HEPATITIS C ANTIBODY: Hep C Virus Ab: <0.1 s/co ratio (ref 0.0–0.9)

## 2016-01-04 LAB — LIPID PANEL
CHOL/HDL RATIO: 6.5 ratio — AB (ref 0.0–5.0)
Cholesterol, Total: 202 mg/dL — ABNORMAL HIGH (ref 100–199)
HDL: 31 mg/dL — ABNORMAL LOW (ref >39)
LDL CALC: 114 mg/dL — AB (ref 0–99)
Triglycerides: 287 mg/dL — ABNORMAL HIGH (ref 0–149)
VLDL CHOLESTEROL CAL: 57 mg/dL — AB (ref 5–40)

## 2016-01-04 LAB — HIV ANTIBODY (ROUTINE TESTING W REFLEX): HIV Screen 4th Generation wRfx: NONREACTIVE

## 2016-02-04 ENCOUNTER — Encounter: Payer: Self-pay | Admitting: Family

## 2016-02-05 NOTE — Progress Notes (Addendum)
+    Cardiology Office Note   Date:  02/06/2016   ID:  Cesar Morales, DOB [birth year], MRN [id number]  PCP:  Jannifer Rodney, FNP  Cardiologist:   Rollene Rotunda, MD  Referring:  Jannifer Rodney, FNP  Chief Complaint  Patient presents with  . Tachycardia     History of Present Illness: Cesar Morales is a 55 y.o. male who presents for evaluation of chest pain.  He was hospitalized in March with chest pain.  He ruled out. I reviewed these records. A previous catheterization in 2016 done in [place] had demonstrated normal coronaries.  Echo during that admission was normal.  However, I could not find this report. I did see the cath report. He tells me that he was told he had small vessels and he was started on Imdur.  He also tells me that he was told he had a "grissle" around his heart.  He continues to get chest discomfort about twice monthly. His episodes sounds clear. They can be brought on with activity he can happen at rest. He might be able to do some activities without bringing them on. He describes dizziness and nose then that the episodes are about to happen. He gets short of breath and nauseated. He gets a discomfort or pressure in his chest. It is severe and at times he almost felt like he was given a pass out. It might last for 10-15 minutes. He feels bad afterwards. He says it's been a relatively stable pattern happening for about the past year. He denies any PND or orthopnea. He said no palpitations, presyncope or syncope.    Past Medical History:  Diagnosis Date  . COPD (chronic obstructive pulmonary disease) (HCC)   . Coronary artery disease   . Diabetes mellitus without complication (HCC)   . Dyslipidemia   . HTN (hypertension)     Past Surgical History:  Procedure Laterality Date  . CHOLECYSTECTOMY    . HERNIA REPAIR       Current Outpatient Prescriptions  Medication Sig Dispense Refill  . albuterol (PROVENTIL HFA;VENTOLIN HFA) 108 (90 Base) MCG/ACT inhaler Inhale 2  puffs into the lungs every 6 (six) hours as needed for wheezing or shortness of breath.    Marland Kitchen aspirin EC 81 MG tablet Take 81 mg by mouth daily.    Marland Kitchen atorvastatin (LIPITOR) 10 MG tablet Take 1 tablet (10 mg total) by mouth daily. 90 tablet 1  . chlorthalidone (HYGROTON) 25 MG tablet Take 1 tablet (25 mg total) by mouth daily. 90 tablet 1  . gabapentin (NEURONTIN) 300 MG capsule Take 1 capsule (300 mg total) by mouth 3 (three) times daily. 90 capsule 3  . glipiZIDE (GLUCOTROL) 5 MG tablet Take 1 tablet (5 mg total) by mouth daily before breakfast. 90 tablet 1  . ibuprofen (ADVIL,MOTRIN) 200 MG tablet Take 600 mg by mouth daily as needed for moderate pain.    . isosorbide mononitrate (IMDUR) 120 MG 24 hr tablet Take 1 tablet (120 mg total) by mouth daily. 30 tablet 6  . metFORMIN (GLUCOPHAGE) 500 MG tablet Take 1 tablet (500 mg total) by mouth 2 (two) times daily with a meal. 180 tablet 1  . naproxen (NAPROSYN) 500 MG tablet Take 500 mg by mouth daily as needed for moderate pain.    . pantoprazole (PROTONIX) 40 MG tablet Take 1 tablet (40 mg total) by mouth daily. 30 tablet 2  . traZODone (DESYREL) 100 MG tablet Take 1 tablet (100 mg total) by mouth at  bedtime. 30 tablet 2   No current facility-administered medications for this visit.     Allergies:   Lisinopril    Social History:  The patient  reports that he has been smoking.  He has been smoking about 0.50 packs per day. He has never used smokeless tobacco. He reports that he does not drink alcohol or use drugs.   Family History:  The patient's family history includes Cancer in his father; Heart disease in his father; Heart disease (age of onset: 38) in his mother.    ROS:  Please see the history of present illness.   Otherwise, review of systems are positive for none.   All other systems are reviewed and negative.    PHYSICAL EXAM: VS:  BP 100/70   Pulse 93   Ht 6' (1.829 m)   Wt 280 lb (127 kg)   BMI 37.97 kg/m  , BMI Body mass  index is 37.97 kg/m. GENERAL:  Well appearing HEENT:  Pupils equal round and reactive, fundi not visualized, oral mucosa unremarkable NECK:  No jugular venous distention, waveform within normal limits, carotid upstroke brisk and symmetric, no bruits, no thyromegaly LYMPHATICS:  No cervical, inguinal adenopathy LUNGS:  Clear to auscultation bilaterally BACK:  No CVA tenderness CHEST:  Unremarkable HEART:  PMI not displaced or sustained,S1 and S2 within normal limits, no S3, no S4, no clicks, no rubs, no murmurs ABD:  Flat, positive bowel sounds normal in frequency in pitch, no bruits, no rebound, no guarding, no midline pulsatile mass, no hepatomegaly, no splenomegaly EXT:  2 plus pulses throughout, no edema, no cyanosis no clubbing SKIN:  No rashes no nodules NEURO:  Cranial nerves II through XII grossly intact, motor grossly intact throughout PSYCH:  Cognitively intact, oriented to person place and time    EKG:  EKG is ordered today. The ekg ordered today demonstrates sinus rhythm, rate 93, rightward axis, intervals within normal limits, no acute ST-T wave changes.   Recent Labs: 08/08/2015: B Natriuretic Peptide 25.0; Hemoglobin 16.0; Platelets 292 01/03/2016: ALT 23; BUN 13; Creatinine, Ser 1.14; Potassium 4.1; Sodium 139    Lipid Panel    Component Value Date/Time   CHOL 202 (H) 01/03/2016 0957   TRIG 287 (H) 01/03/2016 0957   HDL 31 (L) 01/03/2016 0957   CHOLHDL 6.5 (H) 01/03/2016 0957   LDLCALC 114 (H) 01/03/2016 0957      Wt Readings from Last 3 Encounters:  02/06/16 280 lb (127 kg)  01/03/16 279 lb 12.8 oz (126.9 kg)  08/09/15 256 lb 12.8 oz (116.5 kg)      Other studies Reviewed: Additional studies/ records that were reviewed today include: Cath and hospital record. Review of the above records demonstrates:  Please see elsewhere in the note.     ASSESSMENT AND PLAN:  CHEST PAIN:  The patient's chest pain may be nonanginal or could be coronary spasm. I need  to see the actual from [place]. Assuming it might be spasm I'm going to start by increasing his Imdur to 120 mg daily. Might also use a calcium channel blocker in the future.  HTN:  The blood pressure is at target. No change in medications is indicated. We will continue with therapeutic lifestyle changes (TLC).  TOBACCO:  He is down to one quarter pack cigarettes from 2 packs per day and is going to quit at the end of this month. We discussed this strategy and I applauded his efforts.  OBESITY:  We will continue to talk about  this in the future.   Current medicines are reviewed at length with the patient today.  The patient does not have concerns regarding medicines.  The following changes have been made:  no change  Labs/ tests ordered today include:   Orders Placed This Encounter  Procedures  . EKG 12-Lead     Disposition:   FU with me in one month.     Signed, Rollene Rotunda, MD  02/06/2016 3:07 PM    [facility]

## 2016-02-06 ENCOUNTER — Encounter: Payer: Self-pay | Admitting: Cardiology

## 2016-02-06 ENCOUNTER — Ambulatory Visit (INDEPENDENT_AMBULATORY_CARE_PROVIDER_SITE_OTHER): Payer: 59 | Admitting: Cardiology

## 2016-02-06 DIAGNOSIS — I25118 Atherosclerotic heart disease of native coronary artery with other forms of angina pectoris: Secondary | ICD-10-CM | POA: Diagnosis not present

## 2016-02-06 LAB — HM DIABETES EYE EXAM

## 2016-02-06 MED ORDER — ISOSORBIDE MONONITRATE ER 120 MG PO TB24: 120.0000 mg | ORAL_TABLET | Freq: Every day | ORAL | 6 refills | Status: DC

## 2016-02-06 NOTE — Patient Instructions (Signed)
Medication Instructions:  Please increase your Imdur (Isosorbide) to120 mg a day. Continue all other medications as listed.  Follow-Up: Follow up in 1 month with Dr Antoine Poche in [place].  If you need a refill on your cardiac medications before your next appointment, please call your pharmacy.  Thank you for choosing [facility]!!

## 2016-02-20 ENCOUNTER — Encounter: Payer: Self-pay | Admitting: Cardiology

## 2016-03-04 NOTE — Progress Notes (Signed)
+    Cardiology Office Note   Date:  03/05/2016   ID:  Deandre Brannan, DOB Sep 25, 1960, MRN [id number]  PCP:  Jannifer Rodney, FNP  Cardiologist:   Rollene Rotunda, MD  Referring:  Jannifer Rodney, FNP  Chief Complaint  Patient presents with  . Coronary Artery Disease     History of Present Illness: Cesar Morales is a 55 y.o. male who presents for evaluation of chest pain.  He was hospitalized in March with chest pain.  He ruled out. IA previous catheterization in 2016 done in [place] demonstrated normal coronaries.  Echo during that admission was normal.  At the last visit the patient was having chest pain that was likely spasm.  I used Imdur and he is back to reivew his symptoms.  Since I last saw him he has done well.  The patient denies any new symptoms such as chest discomfort, neck or arm discomfort. There has been no new shortness of breath, PND or orthopnea. There have been no reported palpitations, presyncope or syncope.  He has had only one episode of chest pain since I saw him.   Past Medical History:  Diagnosis Date  . COPD (chronic obstructive pulmonary disease) (HCC)   . Coronary artery disease   . Diabetes mellitus without complication (HCC)   . Dyslipidemia   . HTN (hypertension)     Past Surgical History:  Procedure Laterality Date  . CHOLECYSTECTOMY    . HERNIA REPAIR       Current Outpatient Prescriptions  Medication Sig Dispense Refill  . albuterol (PROVENTIL HFA;VENTOLIN HFA) 108 (90 Base) MCG/ACT inhaler Inhale 2 puffs into the lungs every 6 (six) hours as needed for wheezing or shortness of breath.    Marland Kitchen aspirin EC 81 MG tablet Take 81 mg by mouth daily.    Marland Kitchen atorvastatin (LIPITOR) 10 MG tablet Take 1 tablet (10 mg total) by mouth daily. 90 tablet 1  . chlorthalidone (HYGROTON) 25 MG tablet Take 1 tablet (25 mg total) by mouth daily. 90 tablet 1  . gabapentin (NEURONTIN) 300 MG capsule Take 1 capsule (300 mg total) by mouth 3 (three) times daily. 90 capsule 3  .  glipiZIDE (GLUCOTROL) 5 MG tablet Take 1 tablet (5 mg total) by mouth daily before breakfast. 90 tablet 1  . ibuprofen (ADVIL,MOTRIN) 200 MG tablet Take 600 mg by mouth daily as needed for moderate pain.    . isosorbide mononitrate (IMDUR) 120 MG 24 hr tablet Take 1 tablet (120 mg total) by mouth daily. 30 tablet 6  . metFORMIN (GLUCOPHAGE) 500 MG tablet Take 1 tablet (500 mg total) by mouth 2 (two) times daily with a meal. 180 tablet 1  . naproxen (NAPROSYN) 500 MG tablet Take 500 mg by mouth daily as needed for moderate pain.    . pantoprazole (PROTONIX) 40 MG tablet Take 1 tablet (40 mg total) by mouth daily. 30 tablet 2  . traZODone (DESYREL) 100 MG tablet Take 1 tablet (100 mg total) by mouth at bedtime. 30 tablet 2   No current facility-administered medications for this visit.     Allergies:   Lisinopril     ROS:  Please see the history of present illness.   Otherwise, review of systems are positive for none.   All other systems are reviewed and negative.    PHYSICAL EXAM: VS:  BP 120/84   Pulse 92   Ht 6' (1.829 m)   Wt 264 lb (119.7 kg)   BMI 35.80 kg/m  ,  BMI Body mass index is 35.8 kg/m. GENERAL:  Well appearing HEENT:  Pupils equal round and reactive, fundi not visualized, oral mucosa unremarkable NECK:  No jugular venous distention, waveform within normal limits, carotid upstroke brisk and symmetric, no bruits, no thyromegaly LUNGS:  Clear to auscultation bilaterally BACK:  No CVA tenderness CHEST:  Unremarkable HEART:  PMI not displaced or sustained,S1 and S2 within normal limits, no S3, no S4, no clicks, no rubs, no murmurs ABD:  Flat, positive bowel sounds normal in frequency in pitch, no bruits, no rebound, no guarding, no midline pulsatile mass, no hepatomegaly, no splenomegaly EXT:  2 plus pulses throughout, no edema, no cyanosis no clubbing    EKG:  EKG is not ordered today.   Recent Labs: 08/08/2015: B Natriuretic Peptide 25.0; Hemoglobin 16.0; Platelets  292 01/03/2016: ALT 23; BUN 13; Creatinine, Ser 1.14; Potassium 4.1; Sodium 139    Lipid Panel    Component Value Date/Time   CHOL 202 (H) 01/03/2016 0957   TRIG 287 (H) 01/03/2016 0957   HDL 31 (L) 01/03/2016 0957   CHOLHDL 6.5 (H) 01/03/2016 0957   LDLCALC 114 (H) 01/03/2016 0957      Wt Readings from Last 3 Encounters:  03/05/16 264 lb (119.7 kg)  02/06/16 280 lb (127 kg)  01/03/16 279 lb 12.8 oz (126.9 kg)      Other studies Reviewed: Additional studies/ records that were reviewed today include: None Review of the above records demonstrates:     ASSESSMENT AND PLAN:  CHEST PAIN:  This is much improved.  No change in therapy for further studies are needed.  He will continue with risk reduction.  HTN:  The blood pressure is at target. No change in medications is indicated. We will continue with therapeutic lifestyle changes (TLC).  TOBACCO:  He plans to quit at the end of the month.   OBESITY:   He has lost weight and I encourage more of this   Current medicines are reviewed at length with the patient today.  The patient does not have concerns regarding medicines.  The following changes have been made:  none  Labs/ tests ordered today include:   No orders of the defined types were placed in this encounter.    Disposition:   FU with me in 12 months.     Signed, Rollene Rotunda, MD  03/05/2016 1:37 PM    [facility]

## 2016-03-05 ENCOUNTER — Encounter: Payer: Self-pay | Admitting: Cardiology

## 2016-03-05 ENCOUNTER — Ambulatory Visit (INDEPENDENT_AMBULATORY_CARE_PROVIDER_SITE_OTHER): Payer: 59 | Admitting: Cardiology

## 2016-03-05 VITALS — BP 120/84 | HR 92 | Ht 72.0 in | Wt 264.0 lb

## 2016-03-05 DIAGNOSIS — R072 Precordial pain: Secondary | ICD-10-CM

## 2016-03-05 NOTE — Patient Instructions (Signed)

## 2016-04-08 ENCOUNTER — Ambulatory Visit: Payer: 59 | Admitting: Family

## 2016-04-16 ENCOUNTER — Ambulatory Visit: Payer: Self-pay | Admitting: Cardiology

## 2016-04-22 ENCOUNTER — Encounter: Payer: Self-pay | Admitting: Family

## 2016-04-22 ENCOUNTER — Ambulatory Visit (INDEPENDENT_AMBULATORY_CARE_PROVIDER_SITE_OTHER): Payer: 59 | Admitting: Family

## 2016-04-22 VITALS — BP 132/78 | HR 78 | Temp 97.1°F | Ht 72.0 in | Wt 282.4 lb

## 2016-04-22 DIAGNOSIS — E669 Obesity, unspecified: Secondary | ICD-10-CM | POA: Diagnosis not present

## 2016-04-22 DIAGNOSIS — J449 Chronic obstructive pulmonary disease, unspecified: Secondary | ICD-10-CM

## 2016-04-22 DIAGNOSIS — Z23 Encounter for immunization: Secondary | ICD-10-CM | POA: Diagnosis not present

## 2016-04-22 DIAGNOSIS — G4700 Insomnia, unspecified: Secondary | ICD-10-CM

## 2016-04-22 DIAGNOSIS — I25118 Atherosclerotic heart disease of native coronary artery with other forms of angina pectoris: Secondary | ICD-10-CM | POA: Diagnosis not present

## 2016-04-22 DIAGNOSIS — G8929 Other chronic pain: Secondary | ICD-10-CM | POA: Diagnosis not present

## 2016-04-22 DIAGNOSIS — E785 Hyperlipidemia, unspecified: Secondary | ICD-10-CM

## 2016-04-22 DIAGNOSIS — K219 Gastro-esophageal reflux disease without esophagitis: Secondary | ICD-10-CM

## 2016-04-22 DIAGNOSIS — M545 Low back pain, unspecified: Secondary | ICD-10-CM

## 2016-04-22 DIAGNOSIS — K5900 Constipation, unspecified: Secondary | ICD-10-CM | POA: Diagnosis not present

## 2016-04-22 DIAGNOSIS — M549 Dorsalgia, unspecified: Secondary | ICD-10-CM

## 2016-04-22 DIAGNOSIS — E1142 Type 2 diabetes mellitus with diabetic polyneuropathy: Secondary | ICD-10-CM

## 2016-04-22 DIAGNOSIS — E119 Type 2 diabetes mellitus without complications: Secondary | ICD-10-CM

## 2016-04-22 LAB — BAYER DCA HB A1C WAIVED: HB A1C (BAYER DCA - WAIVED): 6.6 % (ref <7.0)

## 2016-04-22 MED ORDER — CYCLOBENZAPRINE HCL 10 MG PO TABS: 10.0000 mg | ORAL_TABLET | Freq: Three times a day (TID) | ORAL | 0 refills | Status: DC | PRN

## 2016-04-22 MED ORDER — SUVOREXANT 10 MG PO TABS: 10.0000 mg | ORAL_TABLET | Freq: Every evening | ORAL | 3 refills | Status: DC | PRN

## 2016-04-22 MED ORDER — GABAPENTIN 400 MG PO CAPS: 400.0000 mg | ORAL_CAPSULE | Freq: Three times a day (TID) | ORAL | 1 refills | Status: DC

## 2016-04-22 NOTE — Progress Notes (Signed)
Subjective:    Patient ID: Cesar Morales, male    DOB: 22-Dec-1960, 55 y.o.   MRN: [id number]  Pt presents to the office today for chronic follow up. PT is followed by  Cardiologists annually for CAD. Stable.  Diabetes  He presents for his follow-up diabetic visit. He has type 2 diabetes mellitus. His disease course has been fluctuating. Associated symptoms include foot paresthesias. Pertinent negatives for diabetes include no blurred vision and no visual change. There are no hypoglycemic complications. Symptoms are worsening. Diabetic complications include heart disease and peripheral neuropathy. Pertinent negatives for diabetic complications include no nephropathy. Risk factors for coronary artery disease include diabetes mellitus, dyslipidemia, family history, obesity, hypertension, male sex, tobacco exposure and sedentary lifestyle. Current diabetic treatment includes oral agent (dual therapy). He is compliant with treatment all of the time. He is following a generally healthy diet. His breakfast blood glucose range is generally 140-180 mg/dl. An ACE inhibitor/angiotensin II receptor blocker is not being taken. Eye exam is current (02/2016).  Hyperlipidemia  This is a chronic problem. The current episode started more than 1 year ago. The problem is uncontrolled. Recent lipid tests were reviewed and are high. Exacerbating diseases include obesity. Factors aggravating his hyperlipidemia include smoking. Current antihyperlipidemic treatment includes diet change. The current treatment provides no improvement of lipids. Risk factors for coronary artery disease include diabetes mellitus, dyslipidemia, family history, hypertension, male sex, obesity and a sedentary lifestyle.  Gastroesophageal Reflux  He reports no belching, no dysphagia or no heartburn. This is a chronic problem. The current episode started more than 1 year ago. The problem occurs rarely. The problem has been resolved. The symptoms are  aggravated by certain foods, lying down and smoking. He has tried a PPI for the symptoms. The treatment provided moderate relief.  Insomnia  Primary symptoms: difficulty falling asleep, frequent awakening.  The current episode started more than one year. The onset quality is gradual. The problem has been waxing and waning since onset. Past treatments include medication. The treatment provided mild relief.  Constipation  This is a chronic problem. The current episode started more than 1 year ago. The problem has been waxing and waning since onset. His stool frequency is 4 to 5 times per week. Associated symptoms include back pain.  Back Pain  This is a chronic problem. The current episode started more than 1 year ago. The problem occurs constantly. The problem is unchanged. The pain is present in the lumbar spine. The quality of the pain is described as aching. The pain is at a severity of 10/10. The pain is severe. Risk factors include sedentary lifestyle.  COPD PT currently taking albuterol as needed. PT currently smoking  a pack over two weeks. PT states he is trying to "cut back". Peripheral Neuropathy Pt is currently taking gabapentin 300 mg TID. PT states he has constant burning pain of 8-9.    Review of Systems  Eyes: Negative for blurred vision.  Gastrointestinal: Positive for constipation. Negative for dysphagia and heartburn.  Musculoskeletal: Positive for back pain.  Psychiatric/Behavioral: The patient has insomnia.    Social History   Social History  . Marital status: Legally Separated    Spouse name: N/A  . Number of children: N/A  . Years of education: N/A   Social History Main Topics  . Smoking status: Current Every Day Smoker    Packs/day: 0.50  . Smokeless tobacco: Never Used  . Alcohol use No  . Drug use: No  . Sexual  activity: Not Asked   Other Topics Concern  . None   Social History Narrative  . None    Family History  Problem Relation Age of Onset  .  Heart disease Mother 20    Died age 19  . Heart disease Father     Atrial fib  . Cancer Father        Objective:   Physical Exam  Constitutional: He is oriented to person, place, and time. He appears well-developed and well-nourished. No distress.  HENT:  Head: Normocephalic.  Right Ear: External ear normal.  Left Ear: External ear normal.  Nose: Nose normal.  Mouth/Throat: Oropharynx is clear and moist.  Eyes: Pupils are equal, round, and reactive to light. Right eye exhibits no discharge. Left eye exhibits no discharge.  Neck: Normal range of motion. Neck supple. No thyromegaly present.  Cardiovascular: Normal rate, regular rhythm, normal heart sounds and intact distal pulses.   No murmur heard. Pulmonary/Chest: Effort normal and breath sounds normal. No respiratory distress. He has no wheezes.  Abdominal: Soft. Bowel sounds are normal. He exhibits no distension. There is no tenderness.  Musculoskeletal: Normal range of motion. He exhibits no edema or tenderness.  Neurological: He is alert and oriented to person, place, and time. He has normal reflexes. No cranial nerve deficit.  Skin: Skin is warm and dry. No rash noted. No erythema.  Psychiatric: He has a normal mood and affect. His behavior is normal. Judgment and thought content normal.  Vitals reviewed.   BP 132/78   Pulse 78   Temp 97.1 F (36.2 C) (Oral)   Ht 6' (1.829 m)   Wt 282 lb 6.4 oz (128.1 kg)   BMI 38.30 kg/m        Assessment & Plan:  1. Encounter for immunization - Flu Vaccine QUAD 36+ mos IM  2. Chronic obstructive pulmonary disease, unspecified COPD type ([place]) - CMP14+EGFR  3. Coronary artery disease involving native heart with other form of angina pectoris, unspecified vessel or lesion type ([place]) - CMP14+EGFR  4. Constipation, unspecified constipation type - CMP14+EGFR  5. Gastroesophageal reflux disease, esophagitis presence not specified - CMP14+EGFR  6. Hyperlipidemia, unspecified  hyperlipidemia type - CMP14+EGFR - Lipid panel  7. Obesity (BMI 30-39.9) - CMP14+EGFR  8. Insomnia, unspecified type -Trazodone stopped and pt started on Belsomra today -Sleep ritual discussed - CMP14+EGFR - Suvorexant (BELSOMRA) 10 MG TABS; Take 10 mg by mouth at bedtime as needed.  Dispense: 30 tablet; Refill: 3  9. Diabetic peripheral neuropathy (HCC) -Gabapentin increased to 400 mg TID - CMP14+EGFR - Bayer DCA Hb A1c Waived - gabapentin (NEURONTIN) 400 MG capsule; Take 1 capsule (400 mg total) by mouth 3 (three) times daily.  Dispense: 270 capsule; Refill: 1  10. Diabetes mellitus without complication (HCC) -Gabapentin increased to 400 mg TID - CMP14+EGFR - Bayer DCA Hb A1c Waived - gabapentin (NEURONTIN) 400 MG capsule; Take 1 capsule (400 mg total) by mouth 3 (three) times daily.  Dispense: 270 capsule; Refill: 1  11. Chronic bilateral low back pain without sciatica -Flexeril rx given  -Rest ICe and heat - cyclobenzaprine (FLEXERIL) 10 MG tablet; Take 1 tablet (10 mg total) by mouth 3 (three) times daily as needed for muscle spasms.  Dispense: 30 tablet; Refill: 0  Continue all meds Labs pending Health Maintenance reviewed Diet and exercise encouraged RTO 4 months  Evelina Dun, FNP

## 2016-04-22 NOTE — Patient Instructions (Signed)
Insomnia Insomnia is a sleep disorder that makes it difficult to fall asleep or to stay asleep. Insomnia can cause tiredness (fatigue), low energy, difficulty concentrating, mood swings, and poor performance at work or school. There are three different ways to classify insomnia:  Difficulty falling asleep.  Difficulty staying asleep.  Waking up too early in the morning. Any type of insomnia can be long-term (chronic) or short-term (acute). Both are common. Short-term insomnia usually lasts for three months or less. Chronic insomnia occurs at least three times a week for longer than three months. What are the causes? Insomnia may be caused by another condition, situation, or substance, such as:  Anxiety.  Certain medicines.  Gastroesophageal reflux disease (GERD) or other gastrointestinal conditions.  Asthma or other breathing conditions.  Restless legs syndrome, sleep apnea, or other sleep disorders.  Chronic pain.  Menopause. This may include hot flashes.  Stroke.  Abuse of alcohol, tobacco, or illegal drugs.  Depression.  Caffeine.  Neurological disorders, such as Alzheimer disease.  An overactive thyroid (hyperthyroidism). The cause of insomnia may not be known. What increases the risk? Risk factors for insomnia include:  Gender. Women are more commonly affected than men.  Age. Insomnia is more common as you get older.  Stress. This may involve your professional or personal life.  Income. Insomnia is more common in people with lower income.  Lack of exercise.  Irregular work schedule or night shifts.  Traveling between different time zones. What are the signs or symptoms? If you have insomnia, trouble falling asleep or trouble staying asleep is the main symptom. This may lead to other symptoms, such as:  Feeling fatigued.  Feeling nervous about going to sleep.  Not feeling rested in the morning.  Having trouble concentrating.  Feeling irritable,  anxious, or depressed. How is this treated? Treatment for insomnia depends on the cause. If your insomnia is caused by an underlying condition, treatment will focus on addressing the condition. Treatment may also include:  Medicines to help you sleep.  Counseling or therapy.  Lifestyle adjustments. Follow these instructions at home:  Take medicines only as directed by your health care provider.  Keep regular sleeping and waking hours. Avoid naps.  Keep a sleep diary to help you and your health care provider figure out what could be causing your insomnia. Include:  When you sleep.  When you wake up during the night.  How well you sleep.  How rested you feel the next day.  Any side effects of medicines you are taking.  What you eat and drink.  Make your bedroom a comfortable place where it is easy to fall asleep:  Put up shades or special blackout curtains to block light from outside.  Use a white noise machine to block noise.  Keep the temperature cool.  Exercise regularly as directed by your health care provider. Avoid exercising right before bedtime.  Use relaxation techniques to manage stress. Ask your health care provider to suggest some techniques that may work well for you. These may include:  Breathing exercises.  Routines to release muscle tension.  Visualizing peaceful scenes.  Cut back on alcohol, caffeinated beverages, and cigarettes, especially close to bedtime. These can disrupt your sleep.  Do not overeat or eat spicy foods right before bedtime. This can lead to digestive discomfort that can make it hard for you to sleep.  Limit screen use before bedtime. This includes:  Watching TV.  Using your smartphone, tablet, and computer.  Stick to a   routine. This can help you fall asleep faster. Try to do a quiet activity, brush your teeth, and go to bed at the same time each night.  Get out of bed if you are still awake after 15 minutes of trying to  sleep. Keep the lights down, but try reading or doing a quiet activity. When you feel sleepy, go back to bed.  Make sure that you drive carefully. Avoid driving if you feel very sleepy.  Keep all follow-up appointments as directed by your health care provider. This is important. Contact a health care provider if:  You are tired throughout the day or have trouble in your daily routine due to sleepiness.  You continue to have sleep problems or your sleep problems get worse. Get help right away if:  You have serious thoughts about hurting yourself or someone else. This information is not intended to replace advice given to you by your health care provider. Make sure you discuss any questions you have with your health care provider. Document Released: 05/02/2000 Document Revised: 10/05/2015 Document Reviewed: 02/03/2014 Elsevier Interactive Patient Education  2017 [employer]  

## 2016-04-23 LAB — CMP14+EGFR
ALT: 23 IU/L (ref 0–44)
AST: 20 IU/L (ref 0–40)
Albumin/Globulin Ratio: 1.5 (ref 1.2–2.2)
Albumin: 4.3 g/dL (ref 3.5–5.5)
Alkaline Phosphatase: 81 IU/L (ref 39–117)
BUN/Creatinine Ratio: 11 (ref 9–20)
BUN: 11 mg/dL (ref 6–24)
Bilirubin Total: 0.5 mg/dL (ref 0.0–1.2)
CALCIUM: 9.2 mg/dL (ref 8.7–10.2)
CHLORIDE: 100 mmol/L (ref 96–106)
CO2: 23 mmol/L (ref 18–29)
CREATININE: 1.02 mg/dL (ref 0.76–1.27)
GFR, EST AFRICAN AMERICAN: 95 mL/min/{1.73_m2} (ref >59)
GFR, EST NON AFRICAN AMERICAN: 82 mL/min/{1.73_m2} (ref >59)
GLUCOSE: 120 mg/dL — AB (ref 65–99)
Globulin, Total: 2.9 g/dL (ref 1.5–4.5)
Potassium: 4.1 mmol/L (ref 3.5–5.2)
Sodium: 141 mmol/L (ref 134–144)
TOTAL PROTEIN: 7.2 g/dL (ref 6.0–8.5)

## 2016-04-23 LAB — LIPID PANEL
CHOL/HDL RATIO: 5.9 ratio — AB (ref 0.0–5.0)
Cholesterol, Total: 182 mg/dL (ref 100–199)
HDL: 31 mg/dL — AB (ref >39)
LDL Calculated: 114 mg/dL — ABNORMAL HIGH (ref 0–99)
Triglycerides: 186 mg/dL — ABNORMAL HIGH (ref 0–149)
VLDL CHOLESTEROL CAL: 37 mg/dL (ref 5–40)

## 2016-04-24 ENCOUNTER — Other Ambulatory Visit: Payer: Self-pay | Admitting: Family

## 2016-04-28 ENCOUNTER — Telehealth: Payer: Self-pay | Admitting: Family

## 2016-04-28 NOTE — Telephone Encounter (Signed)
Coupon placed up front for pt and pt is aware.

## 2016-07-07 ENCOUNTER — Telehealth: Payer: Self-pay | Admitting: Family

## 2016-07-07 NOTE — Telephone Encounter (Signed)
Go ahead and send metronidazole 500 twice a day and ciprofloxacin 500 twice a day for 10 days

## 2016-07-08 ENCOUNTER — Other Ambulatory Visit: Payer: Self-pay

## 2016-07-08 MED ORDER — METRONIDAZOLE 500 MG PO TABS: 500.0000 mg | ORAL_TABLET | Freq: Two times a day (BID) | ORAL | 0 refills | Status: DC

## 2016-07-08 MED ORDER — CIPROFLOXACIN HCL 500 MG PO TABS: 500.0000 mg | ORAL_TABLET | Freq: Two times a day (BID) | ORAL | 0 refills | Status: DC

## 2016-07-08 NOTE — Telephone Encounter (Signed)
Patient informed via voicemail that Cipro and Flagyl have been sent to [facility]

## 2016-07-14 ENCOUNTER — Other Ambulatory Visit: Payer: Self-pay | Admitting: Family

## 2016-07-15 ENCOUNTER — Other Ambulatory Visit: Payer: Self-pay

## 2016-07-15 ENCOUNTER — Encounter: Payer: Self-pay | Admitting: Family Medicine

## 2016-07-15 ENCOUNTER — Ambulatory Visit (INDEPENDENT_AMBULATORY_CARE_PROVIDER_SITE_OTHER): Payer: Self-pay | Admitting: Family Medicine

## 2016-07-15 VITALS — BP 132/88 | HR 92 | Temp 97.5°F | Ht 72.0 in | Wt 281.0 lb

## 2016-07-15 DIAGNOSIS — Z024 Encounter for examination for driving license: Secondary | ICD-10-CM

## 2016-07-15 DIAGNOSIS — E119 Type 2 diabetes mellitus without complications: Secondary | ICD-10-CM

## 2016-07-15 LAB — URINALYSIS
Bilirubin, UA: NEGATIVE
Glucose, UA: NEGATIVE
Ketones, UA: NEGATIVE
Leukocytes, UA: NEGATIVE
NITRITE UA: NEGATIVE
PH UA: 6 (ref 5.0–7.5)
Protein, UA: NEGATIVE
Specific Gravity, UA: 1.02 (ref 1.005–1.030)
UUROB: 0.2 mg/dL (ref 0.2–1.0)

## 2016-07-15 NOTE — Progress Notes (Signed)
Subjective:  Patient ID: Cesar Morales, male    DOB: 1960-08-12  Age: 56 y.o. 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   He has a past surgical history that includes Cholecystectomy and Hernia repair.   His family history includes Cancer in his father; Heart disease in his father; Heart disease (age of onset: 67) in his mother.He reports that he has been smoking.  He has been smoking about 0.50 packs per day. He has never used smokeless tobacco. He reports that he does not drink alcohol or use drugs.  Current Outpatient Prescriptions on File Prior to Visit  Medication Sig Dispense Refill  . albuterol (PROVENTIL HFA;VENTOLIN HFA) 108 (90 Base) MCG/ACT inhaler Inhale 2 puffs into the lungs every 6 (six) hours as needed for wheezing or shortness of breath.    Marland Kitchen aspirin EC 81 MG tablet Take 81 mg by mouth daily.    Marland Kitchen atorvastatin (LIPITOR) 10 MG tablet Take 1 tablet (10 mg total) by mouth daily. 90 tablet 1  . chlorthalidone (HYGROTON) 25 MG tablet TAKE 1 TABLET DAILY 30 tablet 0  . cyclobenzaprine (FLEXERIL) 10 MG tablet Take 1 tablet (10 mg total) by mouth 3 (three) times daily as needed for muscle spasms. 30 tablet 0  . gabapentin (NEURONTIN) 400 MG capsule Take 1 capsule (400 mg total) by mouth 3 (three) times daily. 270 capsule 1  . glipiZIDE (GLUCOTROL) 5 MG tablet Take 1 tablet (5 mg total) by mouth daily before breakfast. 90 tablet 1  . ibuprofen (ADVIL,MOTRIN) 200 MG tablet Take 600 mg by mouth daily as needed for moderate pain.    . isosorbide mononitrate (IMDUR) 120 MG 24 hr tablet Take 1 tablet (120 mg total) by mouth daily. 30 tablet 6  . metFORMIN (GLUCOPHAGE) 500 MG tablet Take 1 tablet (500 mg total) by  mouth 2 (two) times daily with a meal. 180 tablet 1  . naproxen (NAPROSYN) 500 MG tablet Take 500 mg by mouth daily as needed for moderate pain.    . pantoprazole (PROTONIX) 40 MG tablet Take 1 tablet (40 mg total) by mouth daily. 30 tablet 2   No current facility-administered medications on file prior to visit.     ROS Review of Systems  Constitutional: Negative for chills, diaphoresis, fever and unexpected weight change.  HENT: Negative for congestion, hearing loss, rhinorrhea and sore throat.   Eyes: Negative for visual disturbance.  Respiratory: Negative for cough and shortness of breath.   Cardiovascular: Negative for chest pain.  Gastrointestinal: Negative for abdominal pain, constipation and diarrhea.  Genitourinary: Negative for dysuria and flank pain.  Musculoskeletal: Negative for arthralgias and joint swelling.  Skin: Negative for rash.  Neurological: Negative for dizziness and headaches.  Psychiatric/Behavioral: Negative for dysphoric mood and sleep disturbance.    Objective:  BP 132/88   Pulse 92   Temp 97.5 F (36.4 C) (Oral)   Ht 6' (1.829 m)   Wt 281 lb (127.5 kg)   BMI 38.11 kg/m   Physical Exam  Constitutional: He is oriented to person, place, and time. He appears well-developed and well-nourished. No distress.  HENT:  Head: Normocephalic and atraumatic.  Right Ear: External ear normal.  Left Ear: External ear normal.  Nose: Nose normal.  Mouth/Throat: Oropharynx is clear and  moist.  Eyes: Conjunctivae and EOM are normal. Pupils are equal, round, and reactive to light.  Neck: Normal range of motion. Neck supple. No thyromegaly present.  Cardiovascular: Normal rate, regular rhythm and normal heart sounds.   No murmur heard. Pulmonary/Chest: Effort normal and breath sounds normal. No respiratory distress. He has no wheezes. He has no rales.  Abdominal: Soft. Bowel sounds are normal. He exhibits no distension. There is no tenderness.  Lymphadenopathy:     He has no cervical adenopathy.  Neurological: He is alert and oriented to person, place, and time. He has normal reflexes.  Skin: Skin is warm and dry.  Psychiatric: He has a normal mood and affect. His behavior is normal. Judgment and thought content normal.    Assessment & Plan:   Cesar Morales was seen today for private dot physical.  Diagnoses and all orders for this visit:  Diabetes mellitus without complication (HCC)  Encounter for Department of Transportation (DOT) examination for driving license renewal   I have discontinued Mr. Cesar Morales Suvorexant, metroNIDAZOLE, and ciprofloxacin. I am also having him maintain his aspirin EC, ibuprofen, albuterol, naproxen, atorvastatin, glipiZIDE, metFORMIN, pantoprazole, isosorbide mononitrate, gabapentin, cyclobenzaprine, and chlorthalidone.  No orders of the defined types were placed in this encounter.    Follow-up: No Follow-up on file.  Mechele Claude, M.D.

## 2016-08-14 ENCOUNTER — Other Ambulatory Visit: Payer: Self-pay | Admitting: Family

## 2016-08-25 ENCOUNTER — Ambulatory Visit (INDEPENDENT_AMBULATORY_CARE_PROVIDER_SITE_OTHER): Payer: 59 | Admitting: Family

## 2016-08-25 ENCOUNTER — Encounter: Payer: Self-pay | Admitting: Family

## 2016-08-25 VITALS — BP 128/87 | HR 86 | Temp 97.7°F | Ht 72.0 in | Wt 279.6 lb

## 2016-08-25 DIAGNOSIS — G8929 Other chronic pain: Secondary | ICD-10-CM

## 2016-08-25 DIAGNOSIS — E669 Obesity, unspecified: Secondary | ICD-10-CM | POA: Diagnosis not present

## 2016-08-25 DIAGNOSIS — I25118 Atherosclerotic heart disease of native coronary artery with other forms of angina pectoris: Secondary | ICD-10-CM

## 2016-08-25 DIAGNOSIS — G4700 Insomnia, unspecified: Secondary | ICD-10-CM | POA: Diagnosis not present

## 2016-08-25 DIAGNOSIS — J449 Chronic obstructive pulmonary disease, unspecified: Secondary | ICD-10-CM

## 2016-08-25 DIAGNOSIS — Z1211 Encounter for screening for malignant neoplasm of colon: Secondary | ICD-10-CM | POA: Diagnosis not present

## 2016-08-25 DIAGNOSIS — K219 Gastro-esophageal reflux disease without esophagitis: Secondary | ICD-10-CM

## 2016-08-25 DIAGNOSIS — M545 Low back pain: Secondary | ICD-10-CM

## 2016-08-25 DIAGNOSIS — E119 Type 2 diabetes mellitus without complications: Secondary | ICD-10-CM

## 2016-08-25 DIAGNOSIS — K5900 Constipation, unspecified: Secondary | ICD-10-CM | POA: Diagnosis not present

## 2016-08-25 DIAGNOSIS — E785 Hyperlipidemia, unspecified: Secondary | ICD-10-CM

## 2016-08-25 DIAGNOSIS — E1142 Type 2 diabetes mellitus with diabetic polyneuropathy: Secondary | ICD-10-CM | POA: Diagnosis not present

## 2016-08-25 DIAGNOSIS — Z125 Encounter for screening for malignant neoplasm of prostate: Secondary | ICD-10-CM | POA: Diagnosis not present

## 2016-08-25 DIAGNOSIS — R1031 Right lower quadrant pain: Secondary | ICD-10-CM

## 2016-08-25 LAB — BAYER DCA HB A1C WAIVED: HB A1C: 6.5 % (ref <7.0)

## 2016-08-25 MED ORDER — GABAPENTIN 600 MG PO TABS: 600.0000 mg | ORAL_TABLET | Freq: Three times a day (TID) | ORAL | 1 refills | Status: DC

## 2016-08-25 MED ORDER — DEXLANSOPRAZOLE 60 MG PO CPDR: 60.0000 mg | DELAYED_RELEASE_CAPSULE | Freq: Every day | ORAL | 1 refills | Status: DC

## 2016-08-25 NOTE — Progress Notes (Signed)
Subjective:    Patient ID: Cesar Morales, male    DOB: 1960/09/07, 56 y.o.   MRN: [id number]  Pt presents to the office today for chronic follow up. PT is followed by  Cardiologists annually for CAD. Stable.  Diabetes  He presents for his follow-up diabetic visit. He has type 2 diabetes mellitus. His disease course has been fluctuating. Associated symptoms include foot paresthesias. Pertinent negatives for diabetes include no blurred vision and no visual change. There are no hypoglycemic complications. Symptoms are worsening. Diabetic complications include heart disease and peripheral neuropathy. Pertinent negatives for diabetic complications include no nephropathy. Risk factors for coronary artery disease include diabetes mellitus, dyslipidemia, family history, obesity, hypertension, male sex, tobacco exposure and sedentary lifestyle. Current diabetic treatment includes oral agent (dual therapy). He is compliant with treatment all of the time. He is following a generally healthy diet. His breakfast blood glucose range is generally 140-180 mg/dl. An ACE inhibitor/angiotensin II receptor blocker is not being taken. Eye exam is current (02/2016).  Hyperlipidemia  This is a chronic problem. The current episode started more than 1 year ago. The problem is uncontrolled. Recent lipid tests were reviewed and are high. Exacerbating diseases include obesity. Factors aggravating his hyperlipidemia include smoking. Current antihyperlipidemic treatment includes diet change. The current treatment provides no improvement of lipids. Risk factors for coronary artery disease include diabetes mellitus, dyslipidemia, family history, hypertension, male sex, obesity and a sedentary lifestyle.  Gastroesophageal Reflux  He complains of abdominal pain, heartburn and nausea. He reports no belching or no dysphagia. This is a chronic problem. The current episode started more than 1 year ago. The problem occurs rarely. The problem has  been resolved. The symptoms are aggravated by certain foods, lying down and smoking. He has tried a PPI for the symptoms. The treatment provided mild relief.  Insomnia  Primary symptoms: difficulty falling asleep, frequent awakening.  The current episode started more than one year. The onset quality is gradual. The problem has been waxing and waning since onset. Past treatments include medication. The treatment provided mild relief.  Constipation  This is a chronic problem. The current episode started more than 1 year ago. The problem has been waxing and waning since onset. His stool frequency is 4 to 5 times per week. Associated symptoms include abdominal pain, back pain, diarrhea, nausea and vomiting.  Back Pain  This is a chronic problem. The current episode started more than 1 year ago. The problem occurs constantly. The problem has been waxing and waning since onset. The pain is present in the lumbar spine. The quality of the pain is described as aching. The pain is at a severity of 0/10. The patient is experiencing no pain. Associated symptoms include abdominal pain. Risk factors include sedentary lifestyle.  Abdominal Pain  This is a new problem. The current episode started 1 to 4 weeks ago. The onset quality is gradual. The problem occurs intermittently. The pain is located in the RLQ. The pain is at a severity of 5/10. The pain is moderate. The quality of the pain is aching. Associated symptoms include constipation, diarrhea, nausea and vomiting. Pertinent negatives include no belching. The pain is aggravated by certain positions. He has tried acetaminophen for the symptoms. The treatment provided no relief. His past medical history is significant for GERD.  COPD PT currently taking albuterol as needed. PT currently smoking 5 cigarettes a week. PT states he is trying to "cut back". Peripheral Neuropathy Pt is currently taking gabapentin 400 mg  TID. PT states he has constant burning pain of 10  out 10.    Review of Systems  Eyes: Negative for blurred vision.  Gastrointestinal: Positive for abdominal pain, constipation, diarrhea, heartburn, nausea and vomiting. Negative for dysphagia.  Musculoskeletal: Positive for back pain.  Psychiatric/Behavioral: The patient has insomnia.    Social History   Social History  . Marital status: Legally Separated    Spouse name: N/A  . Number of children: N/A  . Years of education: N/A   Social History Main Topics  . Smoking status: Current Every Day Smoker    Packs/day: 0.50  . Smokeless tobacco: Never Used  . Alcohol use No  . Drug use: No  . Sexual activity: Not Asked   Other Topics Concern  . None   Social History Narrative  . None    Family History  Problem Relation Age of Onset  . Heart disease Mother 74    Died age 47  . Heart disease Father     Atrial fib  . Cancer Father        Objective:   Physical Exam  Constitutional: He is oriented to person, place, and time. He appears well-developed and well-nourished. No distress.  HENT:  Head: Normocephalic.  Right Ear: External ear normal.  Left Ear: External ear normal.  Nose: Nose normal.  Mouth/Throat: Oropharynx is clear and moist.  Eyes: Pupils are equal, round, and reactive to light. Right eye exhibits no discharge. Left eye exhibits no discharge.  Neck: Normal range of motion. Neck supple. No thyromegaly present.  Cardiovascular: Normal rate, regular rhythm, normal heart sounds and intact distal pulses.   No murmur heard. Pulmonary/Chest: Effort normal and breath sounds normal. No respiratory distress. He has no wheezes.  Abdominal: Soft. Bowel sounds are normal. He exhibits no distension. There is no tenderness.  Musculoskeletal: Normal range of motion. He exhibits no edema or tenderness.  Neurological: He is alert and oriented to person, place, and time. He has normal reflexes. No cranial nerve deficit.  Skin: Skin is warm and dry. No rash noted. No  erythema.  Psychiatric: He has a normal mood and affect. His behavior is normal. Judgment and thought content normal.  Vitals reviewed.   BP 128/87   Pulse 86   Temp 97.7 F (36.5 C) (Oral)   Ht 6' (1.829 m)   Wt 279 lb 9.6 oz (126.8 kg)   BMI 37.92 kg/m        Assessment & Plan:  1. Gastroesophageal reflux disease, esophagitis presence not specified -Protonix changed to dexilant 60 mg -Diet discussed- Avoid fried, spicy, citrus foods, caffeine and alcohol -Do not eat 2-3 hours before bedtime -Encouraged small frequent meals -Avoid NSAID's - CMP14+EGFR - dexlansoprazole (DEXILANT) 60 MG capsule; Take 1 capsule (60 mg total) by mouth daily.  Dispense: 90 capsule; Refill: 1  2. Constipation, unspecified constipation type - CMP14+EGFR  3. Diabetic peripheral neuropathy (HCC) -Gabapentin increased to 600 mg TID from 400 mg TID - CMP14+EGFR - gabapentin (NEURONTIN) 600 MG tablet; Take 1 tablet (600 mg total) by mouth 3 (three) times daily.  Dispense: 90 tablet; Refill: 1 - Bayer DCA Hb A1c Waived  4. Diabetes mellitus without complication ([place]) - JIR67+ELFY - Bayer DCA Hb A1c Waived  5. Chronic bilateral low back pain without sciatica - CMP14+EGFR  6. Hyperlipidemia, unspecified hyperlipidemia type - CMP14+EGFR - Lipid panel  7. Insomnia, unspecified type - CMP14+EGFR  8. Obesity (BMI 30-39.9) - CMP14+EGFR  9. Chronic obstructive pulmonary  disease, unspecified COPD type ([place]) - CMP14+EGFR  10. Coronary artery disease involving native heart with other form of angina pectoris, unspecified vessel or lesion type ([place]) - CMP14+EGFR  11. Prostate cancer screening - CMP14+EGFR - PSA, total and free  12. Colon cancer screening - Fecal occult blood, imunochemical; Future  13. RLQ abdominal pain Call office if pain worsens - CBC with Differential/Platelet - Stool culture   Continue all meds Labs pending Health Maintenance reviewed Diet and exercise  encouraged RTO 3 months   Evelina Dun, FNP

## 2016-08-25 NOTE — Patient Instructions (Signed)
Peripheral Neuropathy Peripheral neuropathy is a type of nerve damage. It affects nerves that carry signals between the spinal cord and other parts of the body. These are called peripheral nerves. With peripheral neuropathy, one nerve or a group of nerves may be damaged. What are the causes? Many things can damage peripheral nerves. For some people with peripheral neuropathy, the cause is unknown. Some causes include:  Diabetes. This is the most common cause of peripheral neuropathy.  Injury to a nerve.  Pressure or stress on a nerve that lasts a long time.  Too little vitamin B. Alcoholism can lead to this.  Infections.  Autoimmune diseases, such as multiple sclerosis and systemic lupus erythematosus.  Inherited nerve diseases.  Some medicines, such as cancer drugs.  Toxic substances, such as lead and mercury.  Too little blood flowing to the legs.  Kidney disease.  Thyroid disease.  What are the signs or symptoms? Different people have different symptoms. The symptoms you have will depend on which of your nerves is damaged. Common symptoms include:  Loss of feeling (numbness) in the feet and hands.  Tingling in the feet and hands.  Pain that burns.  Very sensitive skin.  Weakness.  Not being able to move a part of the body (paralysis).  Muscle twitching.  Clumsiness or poor coordination.  Loss of balance.  Not being able to control your bladder.  Feeling dizzy.  Sexual problems.  How is this diagnosed? Peripheral neuropathy is a symptom, not a disease. Finding the cause of peripheral neuropathy can be hard. To figure that out, your health care provider will take a medical history and do a physical exam. A neurological exam will also be done. This involves checking things affected by your brain, spinal cord, and nerves (nervous system). For example, your health care provider will check your reflexes, how you move, and what you can feel. Other types of tests  may also be ordered, such as:  Blood tests.  A test of the fluid in your spinal cord.  Imaging tests, such as CT scans or an MRI.  Electromyography (EMG). This test checks the nerves that control muscles.  Nerve conduction velocity tests. These tests check how fast messages pass through your nerves.  Nerve biopsy. A small piece of nerve is removed. It is then checked under a microscope.  How is this treated?  Medicine is often used to treat peripheral neuropathy. Medicines may include: ? Pain-relieving medicines. Prescription or over-the-counter medicine may be suggested. ? Antiseizure medicine. This may be used for pain. ? Antidepressants. These also may help ease pain from neuropathy. ? Lidocaine. This is a numbing medicine. You might wear a patch or be given a shot. ? Mexiletine. This medicine is typically used to help control irregular heart rhythms.  Surgery. Surgery may be needed to relieve pressure on a nerve or to destroy a nerve that is causing pain.  Physical therapy to help movement.  Assistive devices to help movement. Follow these instructions at home:  Only take over-the-counter or prescription medicines as directed by your health care provider. Follow the instructions carefully for any given medicines. Do not take any other medicines without first getting approval from your health care provider.  If you have diabetes, work closely with your health care provider to keep your blood sugar under control.  If you have numbness in your feet: ? Check every day for signs of injury or infection. Watch for redness, warmth, and swelling. ? Wear padded socks and comfortable   shoes. These help protect your feet.  Do not do things that put pressure on your damaged nerve.  Do not smoke. Smoking keeps blood from getting to damaged nerves.  Avoid or limit alcohol. Too much alcohol can cause a lack of B vitamins. These vitamins are needed for healthy nerves.  Develop a good  support system. Coping with peripheral neuropathy can be stressful. Talk to a mental health specialist or join a support group if you are struggling.  Follow up with your health care provider as directed. Contact a health care provider if:  You have new signs or symptoms of peripheral neuropathy.  You are struggling emotionally from dealing with peripheral neuropathy.  You have a fever. Get help right away if:  You have an injury or infection that is not healing.  You feel very dizzy or begin vomiting.  You have chest pain.  You have trouble breathing. This information is not intended to replace advice given to you by your health care provider. Make sure you discuss any questions you have with your health care provider. Document Released: 04/25/2002 Document Revised: 10/11/2015 Document Reviewed: 01/10/2013 Elsevier Interactive Patient Education  2017 Elsevier Inc.  

## 2016-08-26 ENCOUNTER — Other Ambulatory Visit: Payer: Self-pay | Admitting: Family

## 2016-08-26 LAB — LIPID PANEL
CHOL/HDL RATIO: 6 ratio — AB (ref 0.0–5.0)
Cholesterol, Total: 197 mg/dL (ref 100–199)
HDL: 33 mg/dL — ABNORMAL LOW (ref >39)
LDL Calculated: 109 mg/dL — ABNORMAL HIGH (ref 0–99)
TRIGLYCERIDES: 273 mg/dL — AB (ref 0–149)
VLDL Cholesterol Cal: 55 mg/dL — ABNORMAL HIGH (ref 5–40)

## 2016-08-26 LAB — CMP14+EGFR
A/G RATIO: 1.6 (ref 1.2–2.2)
ALK PHOS: 87 IU/L (ref 39–117)
ALT: 26 IU/L (ref 0–44)
AST: 19 IU/L (ref 0–40)
Albumin: 4.4 g/dL (ref 3.5–5.5)
BILIRUBIN TOTAL: 0.4 mg/dL (ref 0.0–1.2)
BUN/Creatinine Ratio: 12 (ref 9–20)
BUN: 13 mg/dL (ref 6–24)
CALCIUM: 9.5 mg/dL (ref 8.7–10.2)
CHLORIDE: 96 mmol/L (ref 96–106)
CO2: 25 mmol/L (ref 18–29)
Creatinine, Ser: 1.08 mg/dL (ref 0.76–1.27)
GFR calc Af Amer: 88 mL/min/{1.73_m2} (ref >59)
GFR, EST NON AFRICAN AMERICAN: 76 mL/min/{1.73_m2} (ref >59)
GLOBULIN, TOTAL: 2.8 g/dL (ref 1.5–4.5)
Glucose: 138 mg/dL — ABNORMAL HIGH (ref 65–99)
POTASSIUM: 4.2 mmol/L (ref 3.5–5.2)
SODIUM: 138 mmol/L (ref 134–144)
Total Protein: 7.2 g/dL (ref 6.0–8.5)

## 2016-08-26 LAB — CBC WITH DIFFERENTIAL/PLATELET
BASOS ABS: 0.1 10*3/uL (ref 0.0–0.2)
Basos: 1 %
EOS (ABSOLUTE): 0.2 10*3/uL (ref 0.0–0.4)
Eos: 1 %
HEMOGLOBIN: 16.5 g/dL (ref 13.0–17.7)
Hematocrit: 46.6 % (ref 37.5–51.0)
IMMATURE GRANS (ABS): 0 10*3/uL (ref 0.0–0.1)
Immature Granulocytes: 0 %
Lymphocytes Absolute: 2.9 10*3/uL (ref 0.7–3.1)
Lymphs: 22 %
MCH: 30.8 pg (ref 26.6–33.0)
MCHC: 35.4 g/dL (ref 31.5–35.7)
MCV: 87 fL (ref 79–97)
Monocytes Absolute: 0.5 10*3/uL (ref 0.1–0.9)
Monocytes: 4 %
NEUTROS PCT: 72 %
Neutrophils Absolute: 9.4 10*3/uL — ABNORMAL HIGH (ref 1.4–7.0)
Platelets: 294 10*3/uL (ref 150–379)
RBC: 5.35 x10E6/uL (ref 4.14–5.80)
RDW: 14 % (ref 12.3–15.4)
WBC: 13 10*3/uL — AB (ref 3.4–10.8)

## 2016-08-26 LAB — PSA, TOTAL AND FREE
PSA FREE PCT: 26 %
PSA, Free: 0.13 ng/mL
Prostate Specific Ag, Serum: 0.5 ng/mL (ref 0.0–4.0)

## 2016-08-26 MED ORDER — SIMVASTATIN 20 MG PO TABS: 20.0000 mg | ORAL_TABLET | Freq: Every day | ORAL | 3 refills | Status: DC

## 2016-08-28 ENCOUNTER — Other Ambulatory Visit: Payer: Self-pay | Admitting: *Deleted

## 2016-08-28 ENCOUNTER — Other Ambulatory Visit: Payer: Self-pay | Admitting: Family

## 2016-08-28 ENCOUNTER — Other Ambulatory Visit: Payer: 59

## 2016-08-28 DIAGNOSIS — R109 Unspecified abdominal pain: Secondary | ICD-10-CM

## 2016-08-28 DIAGNOSIS — Z1211 Encounter for screening for malignant neoplasm of colon: Secondary | ICD-10-CM

## 2016-08-28 DIAGNOSIS — Z1212 Encounter for screening for malignant neoplasm of rectum: Principal | ICD-10-CM

## 2016-08-29 LAB — FECAL OCCULT BLOOD, IMMUNOCHEMICAL: FECAL OCCULT BLD: POSITIVE — AB

## 2016-08-30 ENCOUNTER — Other Ambulatory Visit: Payer: Self-pay | Admitting: Family

## 2016-09-03 ENCOUNTER — Other Ambulatory Visit: Payer: Self-pay | Admitting: *Deleted

## 2016-09-03 DIAGNOSIS — R195 Other fecal abnormalities: Secondary | ICD-10-CM

## 2016-09-03 LAB — STOOL CULTURE: E COLI SHIGA TOXIN ASSAY: NEGATIVE

## 2016-09-08 ENCOUNTER — Telehealth: Payer: Self-pay | Admitting: Family

## 2016-09-08 DIAGNOSIS — E119 Type 2 diabetes mellitus without complications: Secondary | ICD-10-CM

## 2016-09-08 NOTE — Telephone Encounter (Signed)
What is the name of the medication? metformin  Have you contacted your pharmacy to request a refill? yes  Which pharmacy would you like this sent to? [facility].   Patient notified that their request is being sent to the clinical staff for review and that they should receive a call once it is complete. If they do not receive a call within 24 hours they can check with their pharmacy or our office.

## 2016-09-09 MED ORDER — METFORMIN HCL 500 MG PO TABS: 500.0000 mg | ORAL_TABLET | Freq: Two times a day (BID) | ORAL | 1 refills | Status: DC

## 2016-09-09 NOTE — Telephone Encounter (Signed)
done

## 2016-09-10 ENCOUNTER — Other Ambulatory Visit: Payer: 59

## 2016-09-10 DIAGNOSIS — Z1211 Encounter for screening for malignant neoplasm of colon: Secondary | ICD-10-CM

## 2016-09-10 DIAGNOSIS — Z1212 Encounter for screening for malignant neoplasm of rectum: Principal | ICD-10-CM

## 2016-09-12 LAB — FECAL OCCULT BLOOD, IMMUNOCHEMICAL: FECAL OCCULT BLD: NEGATIVE

## 2016-09-24 ENCOUNTER — Other Ambulatory Visit: Payer: Self-pay | Admitting: Cardiology

## 2016-09-24 NOTE — Telephone Encounter (Signed)
REFILL 

## 2016-10-27 ENCOUNTER — Other Ambulatory Visit: Payer: Self-pay | Admitting: Family

## 2016-10-27 DIAGNOSIS — E1142 Type 2 diabetes mellitus with diabetic polyneuropathy: Secondary | ICD-10-CM

## 2016-12-25 ENCOUNTER — Other Ambulatory Visit: Payer: Self-pay | Admitting: Family

## 2016-12-26 ENCOUNTER — Ambulatory Visit: Payer: 59 | Admitting: Family

## 2017-01-14 ENCOUNTER — Other Ambulatory Visit: Payer: Self-pay | Admitting: Family

## 2017-01-20 ENCOUNTER — Other Ambulatory Visit: Payer: Self-pay | Admitting: Family Medicine

## 2017-02-23 ENCOUNTER — Other Ambulatory Visit: Payer: Self-pay | Admitting: Family

## 2017-02-23 ENCOUNTER — Encounter (HOSPITAL_COMMUNITY): Payer: Self-pay | Admitting: Emergency Medicine

## 2017-02-23 ENCOUNTER — Emergency Department (HOSPITAL_COMMUNITY): Payer: 59

## 2017-02-23 ENCOUNTER — Encounter: Payer: Self-pay | Admitting: Family

## 2017-02-23 ENCOUNTER — Ambulatory Visit (INDEPENDENT_AMBULATORY_CARE_PROVIDER_SITE_OTHER): Payer: 59 | Admitting: Family

## 2017-02-23 ENCOUNTER — Observation Stay (HOSPITAL_COMMUNITY)
Admission: EM | Admit: 2017-02-23 | Discharge: 2017-02-24 | Disposition: A | Discharge status: Home / Self Care | Payer: 59 | Attending: Internal Medicine | Admitting: Internal Medicine

## 2017-02-23 VITALS — BP 124/83 | HR 88 | Temp 97.1°F | Ht 72.0 in | Wt 272.0 lb

## 2017-02-23 DIAGNOSIS — E785 Hyperlipidemia, unspecified: Secondary | ICD-10-CM | POA: Diagnosis not present

## 2017-02-23 DIAGNOSIS — I10 Essential (primary) hypertension: Secondary | ICD-10-CM | POA: Diagnosis not present

## 2017-02-23 DIAGNOSIS — R0789 Other chest pain: Secondary | ICD-10-CM | POA: Diagnosis not present

## 2017-02-23 DIAGNOSIS — Z8249 Family history of ischemic heart disease and other diseases of the circulatory system: Secondary | ICD-10-CM | POA: Diagnosis not present

## 2017-02-23 DIAGNOSIS — E119 Type 2 diabetes mellitus without complications: Secondary | ICD-10-CM

## 2017-02-23 DIAGNOSIS — R946 Abnormal results of thyroid function studies: Secondary | ICD-10-CM | POA: Diagnosis not present

## 2017-02-23 DIAGNOSIS — R42 Dizziness and giddiness: Secondary | ICD-10-CM | POA: Diagnosis not present

## 2017-02-23 DIAGNOSIS — I2510 Atherosclerotic heart disease of native coronary artery without angina pectoris: Secondary | ICD-10-CM | POA: Diagnosis not present

## 2017-02-23 DIAGNOSIS — Z7984 Long term (current) use of oral hypoglycemic drugs: Secondary | ICD-10-CM | POA: Diagnosis not present

## 2017-02-23 DIAGNOSIS — Z7982 Long term (current) use of aspirin: Secondary | ICD-10-CM | POA: Insufficient documentation

## 2017-02-23 DIAGNOSIS — M549 Dorsalgia, unspecified: Secondary | ICD-10-CM | POA: Diagnosis not present

## 2017-02-23 DIAGNOSIS — J449 Chronic obstructive pulmonary disease, unspecified: Secondary | ICD-10-CM | POA: Insufficient documentation

## 2017-02-23 DIAGNOSIS — R079 Chest pain, unspecified: Secondary | ICD-10-CM

## 2017-02-23 DIAGNOSIS — E1142 Type 2 diabetes mellitus with diabetic polyneuropathy: Secondary | ICD-10-CM | POA: Diagnosis not present

## 2017-02-23 DIAGNOSIS — R072 Precordial pain: Secondary | ICD-10-CM

## 2017-02-23 DIAGNOSIS — K219 Gastro-esophageal reflux disease without esophagitis: Secondary | ICD-10-CM | POA: Insufficient documentation

## 2017-02-23 DIAGNOSIS — F17210 Nicotine dependence, cigarettes, uncomplicated: Secondary | ICD-10-CM | POA: Diagnosis not present

## 2017-02-23 DIAGNOSIS — R911 Solitary pulmonary nodule: Secondary | ICD-10-CM | POA: Diagnosis not present

## 2017-02-23 DIAGNOSIS — G8929 Other chronic pain: Secondary | ICD-10-CM | POA: Insufficient documentation

## 2017-02-23 DIAGNOSIS — Z6836 Body mass index (BMI) 36.0-36.9, adult: Secondary | ICD-10-CM | POA: Diagnosis not present

## 2017-02-23 DIAGNOSIS — E669 Obesity, unspecified: Secondary | ICD-10-CM | POA: Diagnosis not present

## 2017-02-23 DIAGNOSIS — I200 Unstable angina: Secondary | ICD-10-CM | POA: Diagnosis not present

## 2017-02-23 LAB — CBC
HCT: 46 % (ref 39.0–52.0)
HEMATOCRIT: 47 % (ref 39.0–52.0)
Hemoglobin: 15.5 g/dL (ref 13.0–17.0)
Hemoglobin: 15.9 g/dL (ref 13.0–17.0)
MCH: 29.4 pg (ref 26.0–34.0)
MCH: 29.7 pg (ref 26.0–34.0)
MCHC: 33.7 g/dL (ref 30.0–36.0)
MCHC: 33.8 g/dL (ref 30.0–36.0)
MCV: 87.1 fL (ref 78.0–100.0)
MCV: 87.7 fL (ref 78.0–100.0)
PLATELETS: 239 10*3/uL (ref 150–400)
PLATELETS: 255 10*3/uL (ref 150–400)
RBC: 5.28 MIL/uL (ref 4.22–5.81)
RBC: 5.36 MIL/uL (ref 4.22–5.81)
RDW: 13.6 % (ref 11.5–15.5)
RDW: 13.8 % (ref 11.5–15.5)
WBC: 11.7 10*3/uL — ABNORMAL HIGH (ref 4.0–10.5)
WBC: 11.8 10*3/uL — AB (ref 4.0–10.5)

## 2017-02-23 LAB — MAGNESIUM: MAGNESIUM: 1.9 mg/dL (ref 1.7–2.4)

## 2017-02-23 LAB — GLUCOSE HEMOCUE WAIVED: GLU HEMOCUE WAIVED: 125 mg/dL — AB (ref 65–99)

## 2017-02-23 LAB — GLUCOSE, CAPILLARY: Glucose-Capillary: 184 mg/dL — ABNORMAL HIGH (ref 65–99)

## 2017-02-23 LAB — BASIC METABOLIC PANEL
Anion gap: 11 (ref 5–15)
BUN: 10 mg/dL (ref 6–20)
CHLORIDE: 103 mmol/L (ref 101–111)
CO2: 22 mmol/L (ref 22–32)
CREATININE: 0.99 mg/dL (ref 0.61–1.24)
Calcium: 9.1 mg/dL (ref 8.9–10.3)
GFR calc non Af Amer: >60 mL/min (ref >60)
Glucose, Bld: 117 mg/dL — ABNORMAL HIGH (ref 65–99)
Potassium: 3.6 mmol/L (ref 3.5–5.1)
Sodium: 136 mmol/L (ref 135–145)

## 2017-02-23 LAB — PHOSPHORUS: Phosphorus: 3.6 mg/dL (ref 2.5–4.6)

## 2017-02-23 LAB — MRSA PCR SCREENING: MRSA BY PCR: NEGATIVE

## 2017-02-23 LAB — I-STAT TROPONIN, ED: Troponin i, poc: 0 ng/mL (ref 0.00–0.08)

## 2017-02-23 LAB — CREATININE, SERUM: Creatinine, Ser: 1.01 mg/dL (ref 0.61–1.24)

## 2017-02-23 LAB — TROPONIN I

## 2017-02-23 LAB — D-DIMER, QUANTITATIVE: D-Dimer, Quant: 0.61 ug/mL-FEU — ABNORMAL HIGH (ref 0.00–0.50)

## 2017-02-23 MED ORDER — SODIUM CHLORIDE 0.9 % IV SOLN: 250.0000 mL | INTRAVENOUS | Status: DC | PRN

## 2017-02-23 MED ORDER — ENOXAPARIN SODIUM 40 MG/0.4ML ~~LOC~~ SOLN
40.0000 mg | SUBCUTANEOUS | Status: DC
  Administered 2017-02-23: 40 mg via SUBCUTANEOUS
  Filled 2017-02-23: qty 0.4

## 2017-02-23 MED ORDER — ONDANSETRON HCL 4 MG/2ML IJ SOLN
4.0000 mg | Freq: Once | INTRAMUSCULAR | Status: AC
  Administered 2017-02-23: 4 mg via INTRAVENOUS
  Filled 2017-02-23: qty 2

## 2017-02-23 MED ORDER — MORPHINE SULFATE (PF) 4 MG/ML IV SOLN
4.0000 mg | Freq: Once | INTRAVENOUS | Status: AC
  Administered 2017-02-23: 4 mg via INTRAVENOUS
  Filled 2017-02-23: qty 1

## 2017-02-23 MED ORDER — GI COCKTAIL ~~LOC~~: 30.0000 mL | Freq: Four times a day (QID) | ORAL | Status: DC | PRN

## 2017-02-23 MED ORDER — MORPHINE SULFATE (PF) 4 MG/ML IV SOLN
4.0000 mg | Freq: Once | INTRAVENOUS | Status: AC
Start: 2017-02-23 — End: 2017-02-23
  Administered 2017-02-23: 4 mg via INTRAVENOUS
  Filled 2017-02-23: qty 1

## 2017-02-23 MED ORDER — SODIUM CHLORIDE 0.9% FLUSH: 3.0000 mL | INTRAVENOUS | Status: DC | PRN

## 2017-02-23 MED ORDER — SODIUM CHLORIDE 0.9 % IV BOLUS (SEPSIS)
500.0000 mL | Freq: Once | INTRAVENOUS | Status: AC
  Administered 2017-02-23: 500 mL via INTRAVENOUS

## 2017-02-23 MED ORDER — ACETAMINOPHEN 325 MG PO TABS: 650.0000 mg | ORAL_TABLET | ORAL | Status: DC | PRN

## 2017-02-23 MED ORDER — SODIUM CHLORIDE 0.9 % IV SOLN
INTRAVENOUS | Status: DC
  Administered 2017-02-23 – 2017-02-24 (×2): via INTRAVENOUS

## 2017-02-23 MED ORDER — SIMVASTATIN 20 MG PO TABS
20.0000 mg | ORAL_TABLET | Freq: Every day | ORAL | Status: DC
Start: 2017-02-24 — End: 2017-02-24

## 2017-02-23 MED ORDER — NITROGLYCERIN 0.4 MG SL SUBL: 0.4000 mg | SUBLINGUAL_TABLET | SUBLINGUAL | Status: DC | PRN

## 2017-02-23 MED ORDER — ALBUTEROL SULFATE (2.5 MG/3ML) 0.083% IN NEBU: 3.0000 mL | INHALATION_SOLUTION | Freq: Four times a day (QID) | RESPIRATORY_TRACT | Status: DC | PRN

## 2017-02-23 MED ORDER — ASPIRIN EC 81 MG PO TBEC
81.0000 mg | DELAYED_RELEASE_TABLET | Freq: Every day | ORAL | Status: DC
  Filled 2017-02-23: qty 1

## 2017-02-23 MED ORDER — SODIUM CHLORIDE 0.9% FLUSH
3.0000 mL | Freq: Two times a day (BID) | INTRAVENOUS | Status: DC
  Administered 2017-02-24: 3 mL via INTRAVENOUS

## 2017-02-23 MED ORDER — IOPAMIDOL (ISOVUE-370) INJECTION 76%
INTRAVENOUS | Status: AC
  Administered 2017-02-23: 100 mL
  Filled 2017-02-23: qty 100

## 2017-02-23 MED ORDER — ONDANSETRON HCL 4 MG/2ML IJ SOLN
4.0000 mg | Freq: Four times a day (QID) | INTRAMUSCULAR | Status: DC | PRN
  Administered 2017-02-23: 4 mg via INTRAVENOUS
  Filled 2017-02-23: qty 2

## 2017-02-23 MED ORDER — GI COCKTAIL ~~LOC~~
30.0000 mL | Freq: Once | ORAL | Status: AC
  Administered 2017-02-23: 30 mL via ORAL
  Filled 2017-02-23: qty 30

## 2017-02-23 MED ORDER — NITROGLYCERIN 2 % TD OINT
1.0000 [in_us] | TOPICAL_OINTMENT | Freq: Four times a day (QID) | TRANSDERMAL | Status: DC
  Administered 2017-02-23 – 2017-02-24 (×3): 1 [in_us] via TOPICAL
  Filled 2017-02-23: qty 30
  Filled 2017-02-23: qty 1

## 2017-02-23 MED ORDER — NITROGLYCERIN 0.4 MG SL SUBL
0.4000 mg | SUBLINGUAL_TABLET | SUBLINGUAL | Status: DC | PRN
  Administered 2017-02-23 (×2): 0.4 mg via SUBLINGUAL
  Filled 2017-02-23: qty 1

## 2017-02-23 MED ORDER — INSULIN ASPART 100 UNIT/ML ~~LOC~~ SOLN: 0.0000 [IU] | Freq: Three times a day (TID) | SUBCUTANEOUS | Status: DC

## 2017-02-23 MED ORDER — MORPHINE SULFATE (PF) 2 MG/ML IV SOLN
2.0000 mg | INTRAVENOUS | Status: DC | PRN
  Administered 2017-02-23 – 2017-02-24 (×5): 2 mg via INTRAVENOUS
  Filled 2017-02-23 (×5): qty 1

## 2017-02-23 MED ORDER — ISOSORBIDE MONONITRATE ER 60 MG PO TB24
120.0000 mg | ORAL_TABLET | Freq: Every day | ORAL | Status: DC
  Administered 2017-02-24: 120 mg via ORAL
  Filled 2017-02-23: qty 2

## 2017-02-23 MED ORDER — ASPIRIN 81 MG PO CHEW
81.0000 mg | CHEWABLE_TABLET | ORAL | Status: AC
  Administered 2017-02-24: 81 mg via ORAL
  Filled 2017-02-23: qty 1

## 2017-02-23 MED ORDER — PANTOPRAZOLE SODIUM 40 MG PO TBEC
40.0000 mg | DELAYED_RELEASE_TABLET | Freq: Every day | ORAL | Status: DC
  Administered 2017-02-24: 40 mg via ORAL
  Filled 2017-02-23: qty 1

## 2017-02-23 MED ORDER — GABAPENTIN 600 MG PO TABS
600.0000 mg | ORAL_TABLET | Freq: Three times a day (TID) | ORAL | Status: DC
  Administered 2017-02-23 – 2017-02-24 (×3): 600 mg via ORAL
  Filled 2017-02-23 (×3): qty 1

## 2017-02-23 NOTE — Consult Note (Signed)
Cardiology Consultation:   Patient ID: Cesar Morales; [id number]; 1961-02-23   Admit date: 02/23/2017 Date of Consult: 02/23/2017  Primary Care Provider: Junie Spencer, FNP Primary Cardiologist: Hochrein Primary Electrophysiologist:  None    Patient Profile:   Cesar Morales is a 56 y.o. male with a hx of DM,  who is being seen today for the evaluation of chest pain  at the request of Dr. Catha Gosselin.  History of Present Illness:   Mr. Cupps was seen a year ago by Dr. Antoine Poche. Marland Kitchen Has a hx of cardiac cath in [place], [place]  - was told that he had small but normal coronaries.   Woke up with "elephant on his chest,  Lots of sweating . 4 AM Pain and pressure , Took a NTG at 7 while at work .  Did not help Got lightheaded,  Nauseated. Tried another NTG at 8 am,    Micah Flesher to family doctor   + nausea with vomitting this am, + dyspnea,  Tingling in both arms and hands  Swimmy headed   Does not get regular exercise,   Was advised to walk - no weight lifting  Works as a Naval architect   Rare ETOH - 3 beers a year Smokes - 1 ppd   Has DM,  Glucoses are under pretty good control. HbA1c was reported to be good LDL is still elevated.  Hx of HTN,  COPD   Past Medical History:  Diagnosis Date  . COPD (chronic obstructive pulmonary disease) (HCC)   . Coronary artery disease   . Diabetes mellitus without complication (HCC)   . Dyslipidemia   . HTN (hypertension)     Past Surgical History:  Procedure Laterality Date  . CHOLECYSTECTOMY    . HERNIA REPAIR       Home Medications:  Prior to Admission medications   Medication Sig Start Date End Date Taking? Authorizing Provider  albuterol (PROVENTIL HFA;VENTOLIN HFA) 108 (90 Base) MCG/ACT inhaler Inhale 2 puffs into the lungs every 6 (six) hours as needed for wheezing or shortness of breath.   Yes [provider]  aspirin EC 81 MG tablet Take 81 mg by mouth daily.   Yes [provider]  chlorthalidone (HYGROTON) 25 MG tablet  TAKE 1 TABLET DAILY 01/21/17  Yes Hawks, Christy A, FNP  dexlansoprazole (DEXILANT) 60 MG capsule Take 1 capsule (60 mg total) by mouth daily. 08/25/16  Yes Hawks, Christy A, FNP  gabapentin (NEURONTIN) 600 MG tablet TAKE  (1)  TABLET  THREE TIMES DAILY. 10/27/16  Yes Hawks, Christy A, FNP  glipiZIDE (GLUCOTROL) 5 MG tablet Take 1 tablet (5 mg total) by mouth daily before breakfast. 01/03/16  Yes Hawks, Christy A, FNP  ibuprofen (ADVIL,MOTRIN) 200 MG tablet Take 600 mg by mouth daily as needed for moderate pain.   Yes [provider]  isosorbide mononitrate (IMDUR) 120 MG 24 hr tablet Take 1 tablet (120 mg total) by mouth daily. 09/24/16  Yes Rollene Rotunda, MD  metFORMIN (GLUCOPHAGE) 500 MG tablet Take 1 tablet (500 mg total) by mouth 2 (two) times daily with a meal. 09/09/16  Yes Hawks, Christy A, FNP  simvastatin (ZOCOR) 20 MG tablet Take 1 tablet (20 mg total) by mouth at bedtime. 08/26/16  Yes HawksEdilia Bo, FNP    Inpatient Medications: Scheduled Meds: . nitroGLYCERIN  1 inch Topical Q6H   Continuous Infusions:  PRN Meds: nitroGLYCERIN  Allergies:    Allergies  Allergen Reactions  . Lisinopril Swelling  angioedema  . Lipitor [Atorvastatin]     Stomach cramps    Social History:   Social History   Social History  . Marital status: Legally Separated    Spouse name: N/A  . Number of children: N/A  . Years of education: N/A   Occupational History  . Not on file.   Social History Main Topics  . Smoking status: Current Every Day Smoker    Packs/day: 0.50  . Smokeless tobacco: Never Used  . Alcohol use No  . Drug use: No  . Sexual activity: Not on file   Other Topics Concern  . Not on file   Social History Narrative  . No narrative on file    Family History:   Family History  Problem Relation Age of Onset  . Heart disease Mother 57       Died age 68  . Heart disease Father        Atrial fib  . Cancer Father      ROS:  Please see the history of  present illness.  ROS  All other ROS reviewed and negative.     Physical Exam/Data:   Vitals:   02/23/17 1430 02/23/17 1500 02/23/17 1630 02/23/17 1730  BP: 122/84 123/86 114/79 125/86  Pulse: 71 68 65   Resp: 12 15 20 12   Temp:      TempSrc:      SpO2: 98% 97% 96%   Weight:      Height:        Intake/Output Summary (Last 24 hours) at 02/23/17 1812 Last data filed at 02/23/17 1721  Gross per 24 hour  Intake              500 ml  Output                0 ml  Net              500 ml   Filed Weights   02/23/17 1229  Weight: 272 lb (123.4 kg)   Body mass index is 36.89 kg/m.  General:  Well nourished, well developed, in no acute distress HEENT: normal Lymph: no adenopathy Neck: no JVD Endocrine:  No thryomegaly Vascular: No carotid bruits; FA pulses 2+ bilaterally without bruits  Cardiac:  normal S1, S2; RRR; no murmur  Lungs:  clear to auscultation bilaterally, no wheezing, rhonchi or rales  Abd: soft, nontender, no hepatomegaly . Obese  Ext: no edema Musculoskeletal:  No deformities, BUE and BLE strength normal and equal Skin: warm and dry  Neuro:  CNs 2-12 intact, no focal abnormalities noted Psych:  Normal affect   EKG:  The EKG was personally reviewed and demonstrates:   NSR , no ST or T wave changes  Telemetry:  Telemetry was personally reviewed and demonstrates:  NSR   Relevant CV Studies:   Laboratory Data:  Chemistry Recent Labs Lab 02/23/17 1235  NA 136  K 3.6  CL 103  CO2 22  GLUCOSE 117*  BUN 10  CREATININE 0.99  CALCIUM 9.1  GFRNONAA >60  GFRAA >60  ANIONGAP 11    No results for input(s): PROT, ALBUMIN, AST, ALT, ALKPHOS, BILITOT in the last 168 hours. Hematology Recent Labs Lab 02/23/17 1235  WBC 11.7*  RBC 5.28  HGB 15.5  HCT 46.0  MCV 87.1  MCH 29.4  MCHC 33.7  RDW 13.6  PLT 239   Cardiac EnzymesNo results for input(s): TROPONINI in the last 168 hours.  Recent Labs Lab  02/23/17 1245  TROPIPOC 0.00    BNPNo results  for input(s): BNP, PROBNP in the last 168 hours.  DDimer  Recent Labs Lab 02/23/17 1235  DDIMER 0.61*    Radiology/Studies:  Dg Chest 2 View  Result Date: 02/23/2017 CLINICAL DATA:  Central chest pain and shortness of breath for 1 day. EXAM: CHEST  2 VIEW COMPARISON:  Single-view of the chest 08/08/2015. FINDINGS: The lungs are clear. Heart size is normal. No pneumothorax or pleural fluid. No acute bony abnormality. IMPRESSION: No acute disease. Electronically Signed   By: Drusilla Kanner M.D.   On: 02/23/2017 13:23   Ct Angio Chest Pe W/cm &/or Wo Cm  Result Date: 02/23/2017 CLINICAL DATA:  Chest pressure and shortness of breath. EXAM: CT ANGIOGRAPHY CHEST WITH CONTRAST TECHNIQUE: Multidetector CT imaging of the chest was performed using the standard protocol during bolus administration of intravenous contrast. Multiplanar CT image reconstructions and MIPs were obtained to evaluate the vascular anatomy. CONTRAST:  100 cc Isovue 370 COMPARISON:  None. FINDINGS: Cardiovascular: Heart is enlarged. Coronary artery calcification is evident. No thoracic aortic aneurysm no filling defect in the opacified pulmonary arteries to suggest the presence of an acute pulmonary embolus. Mediastinum/Nodes: No mediastinal lymphadenopathy. There is no hilar lymphadenopathy. There is no axillary lymphadenopathy. The esophagus has normal imaging features. Lungs/Pleura: No focal airspace consolidation. No pulmonary edema or pleural effusion. 7 mm pulmonary nodule identified left lower lobe on image 70 series 9. Upper Abdomen: The liver shows diffusely decreased attenuation suggesting steatosis. Musculoskeletal: Bone windows reveal no worrisome lytic or sclerotic osseous lesions. Review of the MIP images confirms the above findings. IMPRESSION: 1. No CT evidence for acute pulmonary embolus. 2. 7 mm left lower lobe pulmonary nodule. Non-contrast chest CT at 6-12 months is recommended. If the nodule is stable at time of  repeat CT, then future CT at 18-24 months (from today's scan) is considered optional for low-risk patients, but is recommended for high-risk patients. This recommendation follows the consensus statement: Guidelines for Management of Incidental Pulmonary Nodules Detected on CT Images: From the Fleischner Society 2017; Radiology 2017; 284:228-243. Electronically Signed   By: Kennith Center M.D.   On: 02/23/2017 16:58    Assessment and Plan:   1. Chest pain:  CT angio of the lungs was negative for PE  Was told that he has small but normal cornaries by cath in 2016 in [place], [place] is 270+ lbs.   He would need a 2 day myoview study We may need to do a cath tomorrow to sort this out. I dont think a Coronary CT angiogram will be of much use if he does have small diabetic vessels   Will keep NPO and add to cath board. Will decide in the am if he needs a cath   2. DM  - under fair control according to patient   3. Hyperlipidemia:   LDL is still elevated according to patient. He does not follow a restricted diet Does not get any exercise       For questions or updates, please contact [facility] Please consult [email or web address] for contact info under Cardiology/STEMI.   Signed, Kristeen Miss, MD  02/23/2017 6:12 PM

## 2017-02-23 NOTE — ED Notes (Signed)
Report has been called on Pt. It is outside transfer window. Pt can be taken up at 1930.

## 2017-02-23 NOTE — Patient Instructions (Signed)
Nonspecific Chest Pain °Chest pain can be caused by many different conditions. There is always a chance that your pain could be related to something serious, such as a heart attack or a blood clot in your lungs. Chest pain can also be caused by conditions that are not life-threatening. If you have chest pain, it is very important to follow up with your health care provider. °What are the causes? °Causes of this condition include: °· Heartburn. °· Pneumonia or bronchitis. °· Anxiety or stress. °· Inflammation around your heart (pericarditis) or lung (pleuritis or pleurisy). °· A blood clot in your lung. °· A collapsed lung (pneumothorax). This can develop suddenly on its own (spontaneous pneumothorax) or from trauma to the chest. °· Shingles infection (varicella-zoster virus). °· Heart attack. °· Damage to the bones, muscles, and cartilage that make up your chest wall. This can include: °? Bruised bones due to injury. °? Strained muscles or cartilage due to frequent or repeated coughing or overwork. °? Fracture to one or more ribs. °? Sore cartilage due to inflammation (costochondritis). ° °What increases the risk? °Risk factors for this condition may include: °· Activities that increase your risk for trauma or injury to your chest. °· Respiratory infections or conditions that cause frequent coughing. °· Medical conditions or overeating that can cause heartburn. °· Heart disease or family history of heart disease. °· Conditions or health behaviors that increase your risk of developing a blood clot. °· Having had chicken pox (varicella zoster). ° °What are the signs or symptoms? °Chest pain can feel like: °· Burning or tingling on the surface of your chest or deep in your chest. °· Crushing, pressure, aching, or squeezing pain. °· Dull or sharp pain that is worse when you move, cough, or take a deep breath. °· Pain that is also felt in your back, neck, shoulder, or arm, or pain that spreads to any of these  areas. ° °Your chest pain may come and go, or it may stay constant. °How is this diagnosed? °Lab tests or other studies may be needed to find the cause of your pain. Your health care provider may have you take a test called an ECG (electrocardiogram). An ECG records your heartbeat patterns at the time the test is performed. You may also have other tests, such as: °· Transthoracic echocardiogram (TTE). In this test, sound waves are used to create a picture of the heart structures and to look at how blood flows through your heart. °· Transesophageal echocardiogram (TEE). This is a more advanced imaging test that takes images from inside your body. It allows your health care provider to see your heart in finer detail. °· Cardiac monitoring. This allows your health care provider to monitor your heart rate and rhythm in real time. °· Holter monitor. This is a portable device that records your heartbeat and can help to diagnose abnormal heartbeats. It allows your health care provider to track your heart activity for several days, if needed. °· Stress tests. These can be done through exercise or by taking medicine that makes your heart beat more quickly. °· Blood tests. °· Other imaging tests. ° °How is this treated? °Treatment depends on what is causing your chest pain. Treatment may include: °· Medicines. These may include: °? Acid blockers for heartburn. °? Anti-inflammatory medicine. °? Pain medicine for inflammatory conditions. °? Antibiotic medicine, if an infection is present. °? Medicines to dissolve blood clots. °? Medicines to treat coronary artery disease (CAD). °· Supportive care for conditions that   do not require medicines. This may include: °? Resting. °? Applying heat or cold packs to injured areas. °? Limiting activities until pain decreases. ° °Follow these instructions at home: °Medicines °· If you were prescribed an antibiotic, take it as told by your health care provider. Do not stop taking the  antibiotic even if you start to feel better. °· Take over-the-counter and prescription medicines only as told by your health care provider. °Lifestyle °· Do not use any products that contain nicotine or tobacco, such as cigarettes and e-cigarettes. If you need help quitting, ask your health care provider. °· Do not drink alcohol. °· Make lifestyle changes as directed by your health care provider. These may include: °? Getting regular exercise. Ask your health care provider to suggest some activities that are safe for you. °? Eating a heart-healthy diet. A registered dietitian can help you to learn healthy eating options. °? Maintaining a healthy weight. °? Managing diabetes, if necessary. °? Reducing stress, such as with yoga or relaxation techniques. °General instructions °· Avoid any activities that bring on chest pain. °· If heartburn is the cause for your chest pain, raise (elevate) the head of your bed about 6 inches (15 cm) by putting blocks under the legs. Sleeping with more pillows does not effectively relieve heartburn because it only changes the position of your head. °· Keep all follow-up visits as told by your health care provider. This is important. This includes any further testing if your chest pain does not go away. °Contact a health care provider if: °· Your chest pain does not go away. °· You have a rash with blisters on your chest. °· You have a fever. °· You have chills. °Get help right away if: °· Your chest pain is worse. °· You have a cough that gets worse, or you cough up blood. °· You have severe pain in your abdomen. °· You have severe weakness. °· You faint. °· You have sudden, unexplained chest discomfort. °· You have sudden, unexplained discomfort in your arms, back, neck, or jaw. °· You have shortness of breath at any time. °· You suddenly start to sweat, or your skin gets clammy. °· You feel nauseous or you vomit. °· You suddenly feel light-headed or dizzy. °· Your heart begins to beat  quickly, or it feels like it is skipping beats. °These symptoms may represent a serious problem that is an emergency. Do not wait to see if the symptoms will go away. Get medical help right away. Call your local emergency services (911 in the [place] Do not drive yourself to the hospital. °This information is not intended to replace advice given to you by your health care provider. Make sure you discuss any questions you have with your health care provider. °Document Released: 02/12/2005 Document Revised: 01/28/2016 Document Reviewed: 01/28/2016 °Elsevier Interactive Patient Education © 2017 [employer] ° °

## 2017-02-23 NOTE — H&P (Signed)
[facility] History and Physical  Sonia Stickels WUJ:[id number] DOB: 11-Nov-1960 DOA: 02/23/2017  PCP: Junie Spencer, FNP  Patient coming from: Home  Chief Complaint: Chest pain  HPI: Cesar Morales is a 57 y.o. male with a medical history of coronary artery disease, diabetes, hypertension who presented to emergency room with completes of chest pain, which started this morning at approximately 3 AM. Patient woke up from sleep stating he was in a cold sweat. He states his pain has been centrally located however radiates to the left side of his neck. Pain is somewhat alleviated with morphine and nitroglycerin however has not completely gone away. Patient states the pain is like an "elephant sitting on his chest". Patient also complained of shortness of breath along with nausea and one episode of vomiting. Patient states that he did have a catheterization back in [place] before moving to [place] and was told he has a small arteries. Patient denies any recent travel, ill contacts, headache, dizziness.  ED Course: Found to have active chest pain. [facility] called for admission.  Review of Systems:  All other systems reviewed and are negative.   Past Medical History:  Diagnosis Date  . COPD (chronic obstructive pulmonary disease) (HCC)   . Coronary artery disease   . Diabetes mellitus without complication (HCC)   . Dyslipidemia   . HTN (hypertension)     Past Surgical History:  Procedure Laterality Date  . CHOLECYSTECTOMY    . HERNIA REPAIR      Social History:  reports that he has been smoking.  He has been smoking about 0.50 packs per day. He has never used smokeless tobacco. He reports that he does not drink alcohol or use drugs.   Allergies  Allergen Reactions  . Lisinopril Swelling    angioedema  . Lipitor [Atorvastatin]     Stomach cramps    Family History  Problem Relation Age of Onset  . Heart disease Mother 55       Died age 61  . Heart disease Father    Atrial fib  . Cancer Father      Prior to Admission medications   Medication Sig Start Date End Date Taking? Authorizing Provider  albuterol (PROVENTIL HFA;VENTOLIN HFA) 108 (90 Base) MCG/ACT inhaler Inhale 2 puffs into the lungs every 6 (six) hours as needed for wheezing or shortness of breath.   Yes [provider]  aspirin EC 81 MG tablet Take 81 mg by mouth daily.   Yes [provider]  chlorthalidone (HYGROTON) 25 MG tablet TAKE 1 TABLET DAILY 01/21/17  Yes Hawks, Christy A, FNP  dexlansoprazole (DEXILANT) 60 MG capsule Take 1 capsule (60 mg total) by mouth daily. 08/25/16  Yes Hawks, Christy A, FNP  gabapentin (NEURONTIN) 600 MG tablet TAKE  (1)  TABLET  THREE TIMES DAILY. 10/27/16  Yes Hawks, Christy A, FNP  glipiZIDE (GLUCOTROL) 5 MG tablet Take 1 tablet (5 mg total) by mouth daily before breakfast. 01/03/16  Yes Hawks, Christy A, FNP  ibuprofen (ADVIL,MOTRIN) 200 MG tablet Take 600 mg by mouth daily as needed for moderate pain.   Yes [provider]  isosorbide mononitrate (IMDUR) 120 MG 24 hr tablet Take 1 tablet (120 mg total) by mouth daily. 09/24/16  Yes Rollene Rotunda, MD  metFORMIN (GLUCOPHAGE) 500 MG tablet Take 1 tablet (500 mg total) by mouth 2 (two) times daily with a meal. 09/09/16  Yes Hawks, Christy A, FNP  simvastatin (ZOCOR) 20 MG tablet Take 1 tablet (20  mg total) by mouth at bedtime. 08/26/16  Yes Junie Spencer, FNP    Physical Exam: Vitals:   02/23/17 1500 02/23/17 1630  BP: 123/86 114/79  Pulse: 68 65  Resp: 15 20  Temp:    SpO2: 97% 96%     General: Well developed, well nourished, NAD, appears stated age  HEENT: NCAT, PERRLA, EOMI, Anicteic Sclera, mucous membranes moist.   Neck: Supple, no JVD, no masses  Cardiovascular: S1 S2 auscultated, no rubs, murmurs or gallops. Regular rate and rhythm.  Respiratory: Clear to auscultation bilaterally with equal chest rise  Abdomen: Soft, obese, nontender, nondistended, + bowel  sounds  Extremities: warm dry without cyanosis clubbing or edema  Neuro: AAOx3, cranial nerves grossly intact. Strength 5/5 in patient's upper and lower extremities bilaterally  Skin: Without rashes exudates or nodules  Psych: Normal affect and demeanor with intact judgement and insight  Labs on Admission: I have personally reviewed following labs and imaging studies CBC:  Recent Labs Lab 02/23/17 1235  WBC 11.7*  HGB 15.5  HCT 46.0  MCV 87.1  PLT 239   Basic Metabolic Panel:  Recent Labs Lab 02/23/17 1235  NA 136  K 3.6  CL 103  CO2 22  GLUCOSE 117*  BUN 10  CREATININE 0.99  CALCIUM 9.1   GFR: Estimated Creatinine Clearance: 113 mL/min (by C-G formula based on SCr of 0.99 mg/dL). Liver Function Tests: No results for input(s): AST, ALT, ALKPHOS, BILITOT, PROT, ALBUMIN in the last 168 hours. No results for input(s): LIPASE, AMYLASE in the last 168 hours. No results for input(s): AMMONIA in the last 168 hours. Coagulation Profile: No results for input(s): INR, PROTIME in the last 168 hours. Cardiac Enzymes: No results for input(s): CKTOTAL, CKMB, CKMBINDEX, TROPONINI in the last 168 hours. BNP (last 3 results) No results for input(s): PROBNP in the last 8760 hours. HbA1C: No results for input(s): HGBA1C in the last 72 hours. CBG: No results for input(s): GLUCAP in the last 168 hours. Lipid Profile: No results for input(s): CHOL, HDL, LDLCALC, TRIG, CHOLHDL, LDLDIRECT in the last 72 hours. Thyroid Function Tests: No results for input(s): TSH, T4TOTAL, FREET4, T3FREE, THYROIDAB in the last 72 hours. Anemia Panel: No results for input(s): VITAMINB12, FOLATE, FERRITIN, TIBC, IRON, RETICCTPCT in the last 72 hours. Urine analysis:    Component Value Date/Time   COLORURINE AMBER (A) 08/08/2015 2335   APPEARANCEUR Clear 07/15/2016 0000   LABSPEC 1.015 08/08/2015 2335   PHURINE 6.5 08/08/2015 2335   GLUCOSEU Negative 07/15/2016 0000   HGBUR NEGATIVE 08/08/2015  2335   BILIRUBINUR Negative 07/15/2016 0000   KETONESUR NEGATIVE 08/08/2015 2335   PROTEINUR Negative 07/15/2016 0000   PROTEINUR NEGATIVE 08/08/2015 2335   NITRITE Negative 07/15/2016 0000   NITRITE NEGATIVE 08/08/2015 2335   LEUKOCYTESUR Negative 07/15/2016 0000   Sepsis Labs: @LABRCNTIP (procalcitonin:4,lacticidven:4) )No results found for this or any previous visit (from the past 240 hour(s)).   Radiological Exams on Admission: Dg Chest 2 View  Result Date: 02/23/2017 CLINICAL DATA:  Central chest pain and shortness of breath for 1 day. EXAM: CHEST  2 VIEW COMPARISON:  Single-view of the chest 08/08/2015. FINDINGS: The lungs are clear. Heart size is normal. No pneumothorax or pleural fluid. No acute bony abnormality. IMPRESSION: No acute disease. Electronically Signed   By: 08/10/2015 M.D.   On: 02/23/2017 13:23   Ct Angio Chest Pe W/cm &/or Wo Cm  Result Date: 02/23/2017 CLINICAL DATA:  Chest pressure and shortness of breath. EXAM:  CT ANGIOGRAPHY CHEST WITH CONTRAST TECHNIQUE: Multidetector CT imaging of the chest was performed using the standard protocol during bolus administration of intravenous contrast. Multiplanar CT image reconstructions and MIPs were obtained to evaluate the vascular anatomy. CONTRAST:  100 cc Isovue 370 COMPARISON:  None. FINDINGS: Cardiovascular: Heart is enlarged. Coronary artery calcification is evident. No thoracic aortic aneurysm no filling defect in the opacified pulmonary arteries to suggest the presence of an acute pulmonary embolus. Mediastinum/Nodes: No mediastinal lymphadenopathy. There is no hilar lymphadenopathy. There is no axillary lymphadenopathy. The esophagus has normal imaging features. Lungs/Pleura: No focal airspace consolidation. No pulmonary edema or pleural effusion. 7 mm pulmonary nodule identified left lower lobe on image 70 series 9. Upper Abdomen: The liver shows diffusely decreased attenuation suggesting steatosis. Musculoskeletal:  Bone windows reveal no worrisome lytic or sclerotic osseous lesions. Review of the MIP images confirms the above findings. IMPRESSION: 1. No CT evidence for acute pulmonary embolus. 2. 7 mm left lower lobe pulmonary nodule. Non-contrast chest CT at 6-12 months is recommended. If the nodule is stable at time of repeat CT, then future CT at 18-24 months (from today's scan) is considered optional for low-risk patients, but is recommended for high-risk patients. This recommendation follows the consensus statement: Guidelines for Management of Incidental Pulmonary Nodules Detected on CT Images: From the Fleischner Society 2017; Radiology 2017; 284:228-243. Electronically Signed   By: Kennith Center M.D.   On: 02/23/2017 16:58    EKG: Independently reviewed. Sinus rhythm 77  Assessment/Plan  Chest pain, rule out ACS -Patient with active chest pain -Troponin currently negative -CTA Chest negative for PE -Echocardiogram 08/09/2015 EF 60-65%  -patient follows with Dr. Antoine Poche  -will obtain lipid panel, magnesium, phosphate, TSH -Continue aspirin, statin -Will place patient on morphine and nitro -will hold betablocker at this time, patient HR in the 60s, SBP 120s. Patient has allergy to lisinopril -Cardiology consulted and appreciated  Diabetes mellitus, type II with neuropathy -Hold metformin, glipizide -place on ISS and CBG monitoring  -continue gabapentin for neuropathy  Left lobe pulmonary nodule  -60mm nodule noted CT chest, recommended non-contrast CT 6-21months  GERD -continue PPI  Hyperlipidemia -continue statin  Essential hypertension -hold chlorthalidone  DVT prophylaxis: Heparin  Code Status: Full  Family Communication: Daughter at bedside. Admission, patients condition and plan of care including tests being ordered have been discussed with the patient and daughter who indicate understanding and agree with the plan and Code Status.  Disposition Plan: Home when stable    Consults called: Cardiology   Admission status: observation   Time spent: 70 minutes  Devri Kreher D.O. [facility] Pager [phone]  If 7PM-7AM, please contact night-coverage [email or web address] Password [email or web address] 02/23/2017, 5:51 PM

## 2017-02-23 NOTE — ED Provider Notes (Signed)
Blood pressure 123/86, pulse 68, temperature (!) 97.1 F (36.2 C), temperature source Oral, resp. rate 15, height 6' (1.829 m), weight 123.4 kg (272 lb), SpO2 97 %.  Assuming care from Dr. Pecola Leisure.  In short, Cesar Morales is a 56 y.o. male with a chief complaint of Chest Pain .  Refer to the original H&P for additional details.  The current plan of care is to follow CTA and reassess.   05:14 PM Patient with improved pain but still has some remaining chest discomfort. Plan to apply nitroglycerin ointment. D-dimer elevated slightly with now negative CTA. Patient has significant medical comorbidities for ACS. His primary cardiologist is Dr. Antoine Poche. Plan for admission for CP evaluation. Discussed my impression and plan in detail with the patient.    EKG Interpretation  Date/Time:  Monday February 23 2017 12:29:37 EDT Ventricular Rate:  77 PR Interval:    QRS Duration: 98 QT Interval:  399 QTC Calculation: 452 R Axis:   -49 Text Interpretation:  Sinus rhythm Left anterior fascicular block Baseline wander in lead(s) II III aVF V4 Confirmed by Tilden Fossa [phone]) on 02/23/2017 12:36:28 PM      Discussed patient's case with Hospitalist, Dr. Catha Gosselin to request admission. Patient and family (if present) updated with plan. Care transferred to Hospitalist service.  I reviewed all nursing notes, vitals, pertinent old records, EKGs, labs, imaging (as available).  Alona Bene, MD   Maia Plan, MD 02/23/17 [phone]

## 2017-02-23 NOTE — ED Provider Notes (Signed)
[facility]-EMERGENCY DEPT Provider Note   CSN: [id number] Arrival date & time: 02/23/17  1222     History   Chief Complaint Chief Complaint  Patient presents with  . Chest Pain    HPI Cesar Morales is a 56 y.o. male.  The history is provided by the patient. No language interpreter was used.  Chest Pain     Cesar Morales is a 56 y.o. male who presents to the Emergency Department complaining of chest pain.  At 3 AM he develops central chest pain described as a pressure-like sensation. He reports associated shortness of breath and diaphoresis. The pain is constant in nature but does at times radiate to his left shoulder. He saw his PCP today who referred her to the emergency department. EMS was called. He received aspirin, nitroglycerin and morphine by EMS with improvement in his symptoms. He does report mild bilateral lower extremities swelling last few days. He smokes, has a history of hypertension, hyperlipidemia, diabetes. He does have a family history of cardiac disease in his mother in her 10s. Past Medical History:  Diagnosis Date  . COPD (chronic obstructive pulmonary disease) (HCC)   . Coronary artery disease   . Diabetes mellitus without complication (HCC)   . Dyslipidemia   . HTN (hypertension)     Patient Active Problem List   Diagnosis Date Noted  . Chronic back pain 04/22/2016  . Hyperlipemia 01/03/2016  . GERD (gastroesophageal reflux disease) 01/03/2016  . Diabetic peripheral neuropathy (HCC) 01/03/2016  . Insomnia 01/03/2016  . Obesity (BMI 30-39.9) 01/03/2016  . Constipation 08/09/2015  . Diabetes mellitus without complication (HCC)   . Coronary artery disease   . Chronic obstructive pulmonary disease (HCC)     Past Surgical History:  Procedure Laterality Date  . CHOLECYSTECTOMY    . HERNIA REPAIR         Home Medications    Prior to Admission medications   Medication Sig Start Date End Date Taking? Authorizing Provider  albuterol (PROVENTIL HFA;VENTOLIN  HFA) 108 (90 Base) MCG/ACT inhaler Inhale 2 puffs into the lungs every 6 (six) hours as needed for wheezing or shortness of breath.   Yes [provider]  aspirin EC 81 MG tablet Take 81 mg by mouth daily.   Yes [provider]  chlorthalidone (HYGROTON) 25 MG tablet TAKE 1 TABLET DAILY 01/21/17  Yes Hawks, Christy A, FNP  dexlansoprazole (DEXILANT) 60 MG capsule Take 1 capsule (60 mg total) by mouth daily. 08/25/16  Yes Hawks, Christy A, FNP  gabapentin (NEURONTIN) 600 MG tablet TAKE  (1)  TABLET  THREE TIMES DAILY. 10/27/16  Yes Hawks, Christy A, FNP  glipiZIDE (GLUCOTROL) 5 MG tablet Take 1 tablet (5 mg total) by mouth daily before breakfast. 01/03/16  Yes Hawks, Christy A, FNP  ibuprofen (ADVIL,MOTRIN) 200 MG tablet Take 600 mg by mouth daily as needed for moderate pain.   Yes [provider]  isosorbide mononitrate (IMDUR) 120 MG 24 hr tablet Take 1 tablet (120 mg total) by mouth daily. 09/24/16  Yes Rollene Rotunda, MD  metFORMIN (GLUCOPHAGE) 500 MG tablet Take 1 tablet (500 mg total) by mouth 2 (two) times daily with a meal. 09/09/16  Yes Hawks, Christy A, FNP  simvastatin (ZOCOR) 20 MG tablet Take 1 tablet (20 mg total) by mouth at bedtime. 08/26/16  Yes Hawks, Christy A, FNP  cyclobenzaprine (FLEXERIL) 10 MG tablet Take 1 tablet (10 mg total) by mouth 3 (three) times daily as needed for muscle spasms. Patient not  taking: Reported on 02/23/2017 04/22/16   Junie Spencer, FNP  traZODone (DESYREL) 100 MG tablet TAKE ONE TABLET AT BEDTIME Patient not taking: Reported on 02/23/2017 01/15/17   Junie Spencer, FNP    Family History Family History  Problem Relation Age of Onset  . Heart disease Mother 40       Died age 66  . Heart disease Father        Atrial fib  . Cancer Father     Social History Social History  Substance Use Topics  . Smoking status: Current Every Day Smoker    Packs/day: 0.50  . Smokeless tobacco: Never Used  . Alcohol use No     Allergies     Lisinopril and Lipitor [atorvastatin]   Review of Systems Review of Systems  Cardiovascular: Positive for chest pain.  All other systems reviewed and are negative.    Physical Exam Updated Vital Signs BP 123/86   Pulse 68   Temp (!) 97.1 F (36.2 C) (Oral)   Resp 15   Ht 6' (1.829 m)   Wt 123.4 kg (272 lb)   SpO2 97%   BMI 36.89 kg/m   Physical Exam  Constitutional: He is oriented to person, place, and time. He appears well-developed and well-nourished.  Uncomfortable appearing  HENT:  Head: Normocephalic and atraumatic.  Cardiovascular: Normal rate and regular rhythm.   No murmur heard. Pulmonary/Chest: Effort normal and breath sounds normal. No respiratory distress.  Abdominal: Soft. There is no tenderness. There is no rebound and no guarding.  Musculoskeletal: He exhibits no edema or tenderness.  Neurological: He is alert and oriented to person, place, and time.  Skin: Skin is warm and dry.  Psychiatric: He has a normal mood and affect. His behavior is normal.  Nursing note and vitals reviewed.    ED Treatments / Results  Labs (all labs ordered are listed, but only abnormal results are displayed) Labs Reviewed  BASIC METABOLIC PANEL - Abnormal; Notable for the following:       Result Value   Glucose, Bld 117 (*)    All other components within normal limits  CBC - Abnormal; Notable for the following:    WBC 11.7 (*)    All other components within normal limits  D-DIMER, QUANTITATIVE (NOT AT [facility]) - Abnormal; Notable for the following:    D-Dimer, Quant 0.61 (*)    All other components within normal limits  I-STAT TROPONIN, ED    EKG  EKG Interpretation  Date/Time:  Monday February 23 2017 12:29:37 EDT Ventricular Rate:  77 PR Interval:    QRS Duration: 98 QT Interval:  399 QTC Calculation: 452 R Axis:   -49 Text Interpretation:  Sinus rhythm Left anterior fascicular block Baseline wander in lead(s) II III aVF V4 Confirmed by Tilden Fossa  [phone]) on 02/23/2017 12:36:28 PM       Radiology Dg Chest 2 View  Result Date: 02/23/2017 CLINICAL DATA:  Central chest pain and shortness of breath for 1 day. EXAM: CHEST  2 VIEW COMPARISON:  Single-view of the chest 08/08/2015. FINDINGS: The lungs are clear. Heart size is normal. No pneumothorax or pleural fluid. No acute bony abnormality. IMPRESSION: No acute disease. Electronically Signed   By: Drusilla Kanner M.D.   On: 02/23/2017 13:23    Procedures Procedures (including critical care time)  Medications Ordered in ED Medications  nitroGLYCERIN (NITROSTAT) SL tablet 0.4 mg (0.4 mg Sublingual Given 02/23/17 1533)  gi cocktail (Maalox,Lidocaine,Donnatal) (30 mLs Oral  Given 02/23/17 1355)  sodium chloride 0.9 % bolus 500 mL (500 mLs Intravenous New Bag/Given 02/23/17 1459)  morphine 4 MG/ML injection 4 mg (4 mg Intravenous Given 02/23/17 1532)  ondansetron (ZOFRAN) injection 4 mg (4 mg Intravenous Given 02/23/17 1529)     Initial Impression / Assessment and Plan / ED Course  I have reviewed the triage vital signs and the nursing notes.  Pertinent labs & imaging results that were available during my care of the patient were reviewed by me and considered in my medical decision making (see chart for details).     Patient here for evaluation of chest pain, he had partial improvement with nitroglycerin and morphine. EKG without acute ischemic changes. D-dimer is mildly elevated, plan to obtain CTA to rule out PE. Presentation is not consistent with dissection. Patient care transferred pending CTA chest.    Final Clinical Impressions(s) / ED Diagnoses   Final diagnoses:  Nonspecific chest pain  Precordial chest pain    New Prescriptions New Prescriptions   No medications on file     Tilden Fossa, MD 02/24/17 [phone]

## 2017-02-23 NOTE — Progress Notes (Signed)
   Subjective:    Patient ID: Cesar Morales, male    DOB: 09-Oct-1960, 56 y.o.   MRN: [id number]  Chest Pain   This is a new problem. The current episode started today. The onset quality is sudden. The problem occurs constantly. The problem has been unchanged. The pain is present in the substernal region. The pain is at a severity of 8/10. The pain is moderate. The quality of the pain is described as pressure. The pain does not radiate. Associated symptoms include diaphoresis, dizziness, lower extremity edema, nausea, orthopnea and weakness. Pertinent negatives include no back pain, leg pain, palpitations or shortness of breath. The pain is aggravated by nothing. He has tried nitroglycerin for the symptoms. The treatment provided no relief.  His past medical history is significant for CAD.  Pertinent negatives for past medical history include no MI.      Review of Systems  Constitutional: Positive for diaphoresis.  Respiratory: Negative for shortness of breath.   Cardiovascular: Positive for chest pain and orthopnea. Negative for palpitations.  Gastrointestinal: Positive for nausea.  Musculoskeletal: Negative for back pain.  Neurological: Positive for dizziness and weakness.  All other systems reviewed and are negative.      Objective:   Physical Exam  Constitutional: He is oriented to person, place, and time. He appears well-developed and well-nourished. No distress.  diaphoresis  HENT:  Head: Normocephalic.  Neck: Normal range of motion. Neck supple. No thyromegaly present.  Cardiovascular: Normal rate, regular rhythm, normal heart sounds and intact distal pulses.   No murmur heard. Pulmonary/Chest: Effort normal and breath sounds normal. No respiratory distress. He has no wheezes.  Chest pain   Abdominal: Soft. Bowel sounds are normal. He exhibits no distension. There is no tenderness.  Musculoskeletal: Normal range of motion. He exhibits no edema or tenderness.  Neurological: He  is alert and oriented to person, place, and time.  Skin: Skin is warm and dry. No rash noted. No erythema.  Psychiatric: He has a normal mood and affect. His behavior is normal. Judgment and thought content normal.  Vitals reviewed.     BP 124/83 (BP Location: Left Arm, Patient Position: Sitting, Cuff Size: Large)   Pulse 88   Temp (!) 97.1 F (36.2 C) (Oral)   Ht 6' (1.829 m)   Wt 272 lb (123.4 kg)   BMI 36.89 kg/m      Assessment & Plan:  1. Other chest pain Will send pt to ED Pt refuses EMS, will call his wife to drive  Pt has taken aspirin and nitro, has not responded to Nitro - EKG 12-Lead   *Pt's chest pain has worsen and has become SOB, EMS called to transport pt to [facility].   Jannifer Rodney, FNP

## 2017-02-23 NOTE — ED Triage Notes (Signed)
Patient was awakened at 0300 this morning with 8/10 chest pressure.  Tried to go to work, at 0900 pain increased to 10/10 and he called 911.  Patient has had a total of 4x SL nitro, 324 mg aspirin, and 2mg  morphine PTA.  22g saline lock in left hand. Arrives on 3L O2, does not wear oxygen at baseline.

## 2017-02-24 ENCOUNTER — Observation Stay (HOSPITAL_COMMUNITY): Payer: 59

## 2017-02-24 ENCOUNTER — Ambulatory Visit: Payer: 59 | Admitting: Family

## 2017-02-24 ENCOUNTER — Encounter (HOSPITAL_COMMUNITY)
Admission: EM | Disposition: A | Discharge status: Home / Self Care | Payer: Self-pay | Source: Home / Self Care | Attending: Emergency Medicine

## 2017-02-24 ENCOUNTER — Other Ambulatory Visit: Payer: Self-pay | Admitting: Family

## 2017-02-24 ENCOUNTER — Encounter (HOSPITAL_COMMUNITY): Payer: Self-pay | Admitting: Cardiology

## 2017-02-24 DIAGNOSIS — I25110 Atherosclerotic heart disease of native coronary artery with unstable angina pectoris: Secondary | ICD-10-CM | POA: Diagnosis not present

## 2017-02-24 DIAGNOSIS — E1142 Type 2 diabetes mellitus with diabetic polyneuropathy: Secondary | ICD-10-CM | POA: Diagnosis not present

## 2017-02-24 DIAGNOSIS — R079 Chest pain, unspecified: Secondary | ICD-10-CM | POA: Diagnosis not present

## 2017-02-24 DIAGNOSIS — E119 Type 2 diabetes mellitus without complications: Secondary | ICD-10-CM | POA: Diagnosis not present

## 2017-02-24 DIAGNOSIS — I200 Unstable angina: Secondary | ICD-10-CM | POA: Diagnosis not present

## 2017-02-24 DIAGNOSIS — I10 Essential (primary) hypertension: Secondary | ICD-10-CM | POA: Diagnosis present

## 2017-02-24 HISTORY — PX: LEFT HEART CATH AND CORONARY ANGIOGRAPHY: CATH118249

## 2017-02-24 LAB — CBC
HEMATOCRIT: 43.7 % (ref 39.0–52.0)
HEMOGLOBIN: 15 g/dL (ref 13.0–17.0)
MCH: 30.3 pg (ref 26.0–34.0)
MCHC: 34.3 g/dL (ref 30.0–36.0)
MCV: 88.3 fL (ref 78.0–100.0)
Platelets: 210 10*3/uL (ref 150–400)
RBC: 4.95 MIL/uL (ref 4.22–5.81)
RDW: 13.8 % (ref 11.5–15.5)
WBC: 7.8 10*3/uL (ref 4.0–10.5)

## 2017-02-24 LAB — BASIC METABOLIC PANEL
Anion gap: 10 (ref 5–15)
BUN: 12 mg/dL (ref 6–20)
CHLORIDE: 104 mmol/L (ref 101–111)
CO2: 23 mmol/L (ref 22–32)
CREATININE: 0.99 mg/dL (ref 0.61–1.24)
Calcium: 8.5 mg/dL — ABNORMAL LOW (ref 8.9–10.3)
GFR calc non Af Amer: >60 mL/min (ref >60)
Glucose, Bld: 106 mg/dL — ABNORMAL HIGH (ref 65–99)
Potassium: 3.8 mmol/L (ref 3.5–5.1)
Sodium: 137 mmol/L (ref 135–145)

## 2017-02-24 LAB — TROPONIN I: Troponin I: <0.03 ng/mL (ref <0.03)

## 2017-02-24 LAB — T4, FREE: Free T4: 0.94 ng/dL (ref 0.61–1.12)

## 2017-02-24 LAB — TSH: TSH: 4.795 u[IU]/mL — ABNORMAL HIGH (ref 0.350–4.500)

## 2017-02-24 LAB — GLUCOSE, CAPILLARY
GLUCOSE-CAPILLARY: 109 mg/dL — AB (ref 65–99)
Glucose-Capillary: 106 mg/dL — ABNORMAL HIGH (ref 65–99)

## 2017-02-24 LAB — LIPID PANEL
CHOL/HDL RATIO: 6.1 ratio
Cholesterol: 172 mg/dL (ref 0–200)
HDL: 28 mg/dL — ABNORMAL LOW (ref >40)
LDL CALC: 97 mg/dL (ref 0–99)
TRIGLYCERIDES: 236 mg/dL — AB (ref <150)
VLDL: 47 mg/dL — AB (ref 0–40)

## 2017-02-24 LAB — PROTIME-INR
INR: 0.96
Prothrombin Time: 12.7 seconds (ref 11.4–15.2)

## 2017-02-24 LAB — HIV ANTIBODY (ROUTINE TESTING W REFLEX): HIV Screen 4th Generation wRfx: NONREACTIVE

## 2017-02-24 SURGERY — LEFT HEART CATH AND CORONARY ANGIOGRAPHY
Anesthesia: LOCAL

## 2017-02-24 MED ORDER — FENTANYL CITRATE (PF) 100 MCG/2ML IJ SOLN
INTRAMUSCULAR | Status: DC | PRN
  Administered 2017-02-24: 50 ug via INTRAVENOUS

## 2017-02-24 MED ORDER — HEPARIN SODIUM (PORCINE) 1000 UNIT/ML IJ SOLN
INTRAMUSCULAR | Status: DC | PRN
  Administered 2017-02-24: 6000 [IU] via INTRAVENOUS

## 2017-02-24 MED ORDER — NITROGLYCERIN 0.4 MG SL SUBL: 0.4000 mg | SUBLINGUAL_TABLET | SUBLINGUAL | 1 refills | Status: AC | PRN

## 2017-02-24 MED ORDER — SODIUM CHLORIDE 0.9 % IV SOLN: INTRAVENOUS | Status: AC

## 2017-02-24 MED ORDER — HEPARIN (PORCINE) IN NACL 2-0.9 UNIT/ML-% IJ SOLN
INTRAMUSCULAR | Status: AC
  Filled 2017-02-24: qty 1000

## 2017-02-24 MED ORDER — HEPARIN SODIUM (PORCINE) 1000 UNIT/ML IJ SOLN
INTRAMUSCULAR | Status: AC
  Filled 2017-02-24: qty 1

## 2017-02-24 MED ORDER — MIDAZOLAM HCL 2 MG/2ML IJ SOLN
INTRAMUSCULAR | Status: DC | PRN
  Administered 2017-02-24: 1 mg via INTRAVENOUS

## 2017-02-24 MED ORDER — IOPAMIDOL (ISOVUE-370) INJECTION 76%
INTRAVENOUS | Status: DC | PRN
  Administered 2017-02-24: 80 mL via INTRA_ARTERIAL

## 2017-02-24 MED ORDER — LIDOCAINE HCL (PF) 1 % IJ SOLN
INTRAMUSCULAR | Status: DC | PRN
  Administered 2017-02-24: 5 mL

## 2017-02-24 MED ORDER — IOPAMIDOL (ISOVUE-370) INJECTION 76%
INTRAVENOUS | Status: AC
  Filled 2017-02-24: qty 100

## 2017-02-24 MED ORDER — VERAPAMIL HCL 2.5 MG/ML IV SOLN
INTRAVENOUS | Status: DC | PRN
  Administered 2017-02-24: 10:00:00 via INTRA_ARTERIAL

## 2017-02-24 MED ORDER — VERAPAMIL HCL 2.5 MG/ML IV SOLN
INTRAVENOUS | Status: AC
  Filled 2017-02-24: qty 2

## 2017-02-24 MED ORDER — SODIUM CHLORIDE 0.9% FLUSH: 3.0000 mL | Freq: Two times a day (BID) | INTRAVENOUS | Status: DC

## 2017-02-24 MED ORDER — LIDOCAINE HCL 2 % IJ SOLN
INTRAMUSCULAR | Status: AC
  Filled 2017-02-24: qty 10

## 2017-02-24 MED ORDER — MIDAZOLAM HCL 2 MG/2ML IJ SOLN
INTRAMUSCULAR | Status: AC
  Filled 2017-02-24: qty 2

## 2017-02-24 MED ORDER — HEPARIN (PORCINE) IN NACL 2-0.9 UNIT/ML-% IJ SOLN
INTRAMUSCULAR | Status: AC | PRN
  Administered 2017-02-24: 1000 mL

## 2017-02-24 MED ORDER — SODIUM CHLORIDE 0.9 % IV SOLN: 250.0000 mL | INTRAVENOUS | Status: DC | PRN

## 2017-02-24 MED ORDER — SODIUM CHLORIDE 0.9% FLUSH: 3.0000 mL | INTRAVENOUS | Status: DC | PRN

## 2017-02-24 MED ORDER — FENTANYL CITRATE (PF) 100 MCG/2ML IJ SOLN
INTRAMUSCULAR | Status: AC
  Filled 2017-02-24: qty 2

## 2017-02-24 SURGICAL SUPPLY — 12 items
CATH 5FR JL3.5 JR4 ANG PIG MP (CATHETERS) ×2 IMPLANT
COVER PRB 48X5XTLSCP FOLD TPE (BAG) ×1 IMPLANT
COVER PROBE 5X48 (BAG) ×1
DEVICE RAD COMP TR BAND LRG (VASCULAR PRODUCTS) ×2 IMPLANT
GLIDESHEATH SLEND A-KIT 6F 22G (SHEATH) ×2 IMPLANT
GUIDEWIRE INQWIRE 1.5J.035X260 (WIRE) ×1 IMPLANT
INQWIRE 1.5J .035X260CM (WIRE) ×2
KIT HEART LEFT (KITS) ×2 IMPLANT
PACK CARDIAC CATHETERIZATION (CUSTOM PROCEDURE TRAY) ×2 IMPLANT
SYR MEDRAD MARK V 150ML (SYRINGE) ×2 IMPLANT
TRANSDUCER W/STOPCOCK (MISCELLANEOUS) ×2 IMPLANT
TUBING CIL FLEX 10 FLL-RA (TUBING) ×2 IMPLANT

## 2017-02-24 NOTE — Discharge Instructions (Signed)
Nonspecific Chest Pain °Chest pain can be caused by many different conditions. There is always a chance that your pain could be related to something serious, such as a heart attack or a blood clot in your lungs. Chest pain can also be caused by conditions that are not life-threatening. If you have chest pain, it is very important to follow up with your health care provider. °What are the causes? °Causes of this condition include: °· Heartburn. °· Pneumonia or bronchitis. °· Anxiety or stress. °· Inflammation around your heart (pericarditis) or lung (pleuritis or pleurisy). °· A blood clot in your lung. °· A collapsed lung (pneumothorax). This can develop suddenly on its own (spontaneous pneumothorax) or from trauma to the chest. °· Shingles infection (varicella-zoster virus). °· Heart attack. °· Damage to the bones, muscles, and cartilage that make up your chest wall. This can include: °? Bruised bones due to injury. °? Strained muscles or cartilage due to frequent or repeated coughing or overwork. °? Fracture to one or more ribs. °? Sore cartilage due to inflammation (costochondritis). ° °What increases the risk? °Risk factors for this condition may include: °· Activities that increase your risk for trauma or injury to your chest. °· Respiratory infections or conditions that cause frequent coughing. °· Medical conditions or overeating that can cause heartburn. °· Heart disease or family history of heart disease. °· Conditions or health behaviors that increase your risk of developing a blood clot. °· Having had chicken pox (varicella zoster). ° °What are the signs or symptoms? °Chest pain can feel like: °· Burning or tingling on the surface of your chest or deep in your chest. °· Crushing, pressure, aching, or squeezing pain. °· Dull or sharp pain that is worse when you move, cough, or take a deep breath. °· Pain that is also felt in your back, neck, shoulder, or arm, or pain that spreads to any of these  areas. ° °Your chest pain may come and go, or it may stay constant. °How is this diagnosed? °Lab tests or other studies may be needed to find the cause of your pain. Your health care provider may have you take a test called an ECG (electrocardiogram). An ECG records your heartbeat patterns at the time the test is performed. You may also have other tests, such as: °· Transthoracic echocardiogram (TTE). In this test, sound waves are used to create a picture of the heart structures and to look at how blood flows through your heart. °· Transesophageal echocardiogram (TEE). This is a more advanced imaging test that takes images from inside your body. It allows your health care provider to see your heart in finer detail. °· Cardiac monitoring. This allows your health care provider to monitor your heart rate and rhythm in real time. °· Holter monitor. This is a portable device that records your heartbeat and can help to diagnose abnormal heartbeats. It allows your health care provider to track your heart activity for several days, if needed. °· Stress tests. These can be done through exercise or by taking medicine that makes your heart beat more quickly. °· Blood tests. °· Other imaging tests. ° °How is this treated? °Treatment depends on what is causing your chest pain. Treatment may include: °· Medicines. These may include: °? Acid blockers for heartburn. °? Anti-inflammatory medicine. °? Pain medicine for inflammatory conditions. °? Antibiotic medicine, if an infection is present. °? Medicines to dissolve blood clots. °? Medicines to treat coronary artery disease (CAD). °· Supportive care for conditions that   do not require medicines. This may include: ? Resting. ? Applying heat or cold packs to injured areas. ? Limiting activities until pain decreases.  Follow these instructions at home: Medicines  If you were prescribed an antibiotic, take it as told by your health care provider. Do not stop taking the  antibiotic even if you start to feel better.  Take over-the-counter and prescription medicines only as told by your health care provider. Lifestyle  Do not use any products that contain nicotine or tobacco, such as cigarettes and e-cigarettes. If you need help quitting, ask your health care provider.  Do not drink alcohol.  Make lifestyle changes as directed by your health care provider. These may include: ? Getting regular exercise. Ask your health care provider to suggest some activities that are safe for you. ? Eating a heart-healthy diet. A registered dietitian can help you to learn healthy eating options. ? Maintaining a healthy weight. ? Managing diabetes, if necessary. ? Reducing stress, such as with yoga or relaxation techniques. General instructions  Avoid any activities that bring on chest pain.  If heartburn is the cause for your chest pain, raise (elevate) the head of your bed about 6 inches (15 cm) by putting blocks under the legs. Sleeping with more pillows does not effectively relieve heartburn because it only changes the position of your head.  Keep all follow-up visits as told by your health care provider. This is important. This includes any further testing if your chest pain does not go away. Contact a health care provider if:  Your chest pain does not go away.  You have a rash with blisters on your chest.  You have a fever.  You have chills. Get help right away if:  Your chest pain is worse.  You have a cough that gets worse, or you cough up blood.  You have severe pain in your abdomen.  You have severe weakness.  You faint.  You have sudden, unexplained chest discomfort.  You have sudden, unexplained discomfort in your arms, back, neck, or jaw.  You have shortness of breath at any time.  You suddenly start to sweat, or your skin gets clammy.  You feel nauseous or you vomit.  You suddenly feel light-headed or dizzy.  Your heart begins to beat  quickly, or it feels like it is skipping beats. These symptoms may represent a serious problem that is an emergency. Do not wait to see if the symptoms will go away. Get medical help right away. Call your local emergency services (911 in the [place] Do not drive yourself to the hospital. This information is not intended to replace advice given to you by your health care provider. Make sure you discuss any questions you have with your health care provider. Document Released: 02/12/2005 Document Revised: 01/28/2016 Document Reviewed: 01/28/2016 Elsevier Interactive Patient Education  2017 [employer]  Radial Site Care Refer to this sheet in the next few weeks. These instructions provide you with information about caring for yourself after your procedure. Your health care provider may also give you more specific instructions. Your treatment has been planned according to current medical practices, but problems sometimes occur. Call your health care provider if you have any problems or questions after your procedure. What can I expect after the procedure? After your procedure, it is typical to have the following:  Bruising at the radial site that usually fades within 1-2 weeks.  Blood collecting in the tissue (hematoma) that may be painful to the  touch. It should usually decrease in size and tenderness within 1-2 weeks.  Follow these instructions at home:  Take medicines only as directed by your health care provider.  You may shower 24-48 hours after the procedure or as directed by your health care provider. Remove the bandage (dressing) and gently wash the site with plain soap and water. Pat the area dry with a clean towel. Do not rub the site, because this may cause bleeding.  Do not take baths, swim, or use a hot tub until your health care provider approves.  Check your insertion site every day for redness, swelling, or drainage.  Do not apply powder or lotion to the site.  Do not flex or  bend the affected arm for 24 hours or as directed by your health care provider.  Do not push or pull heavy objects with the affected arm for 24 hours or as directed by your health care provider.  Do not lift over 10 lb (4.5 kg) for 5 days after your procedure or as directed by your health care provider.  Ask your health care provider when it is okay to: ? Return to work or school. ? Resume usual physical activities or sports. ? Resume sexual activity.  Do not drive home if you are discharged the same day as the procedure. Have someone else drive you.  You may drive 24 hours after the procedure unless otherwise instructed by your health care provider.  Do not operate machinery or power tools for 24 hours after the procedure.  If your procedure was done as an outpatient procedure, which means that you went home the same day as your procedure, a responsible adult should be with you for the first 24 hours after you arrive home.  Keep all follow-up visits as directed by your health care provider. This is important. Contact a health care provider if:  You have a fever.  You have chills.  You have increased bleeding from the radial site. Hold pressure on the site. Get help right away if:  You have unusual pain at the radial site.  You have redness, warmth, or swelling at the radial site.  You have drainage (other than a small amount of blood on the dressing) from the radial site.  The radial site is bleeding, and the bleeding does not stop after 30 minutes of holding steady pressure on the site.  Your arm or hand becomes pale, cool, tingly, or numb. This information is not intended to replace advice given to you by your health care provider. Make sure you discuss any questions you have with your health care provider. Document Released: 06/07/2010 Document Revised: 10/11/2015 Document Reviewed: 11/21/2013 Elsevier Interactive Patient Education  2018 [employer].   Heart-Healthy  Eating Plan Heart-healthy meal planning includes:  Limiting unhealthy fats.  Increasing healthy fats.  Making other small dietary changes.  You may need to talk with your doctor or a diet specialist (dietitian) to create an eating plan that is right for you. What types of fat should I choose?  Choose healthy fats. These include olive oil and canola oil, flaxseeds, walnuts, almonds, and seeds.  Eat more omega-3 fats. These include salmon, mackerel, sardines, tuna, flaxseed oil, and ground flaxseeds. Try to eat fish at least twice each week.  Limit saturated fats. ? Saturated fats are often found in animal products, such as meats, butter, and cream. ? Plant sources of saturated fats include palm oil, palm kernel oil, and coconut oil.  Avoid foods  with partially hydrogenated oils in them. These include stick margarine, some tub margarines, cookies, crackers, and other baked goods. These contain trans fats. What general guidelines do I need to follow?  Check food labels carefully. Identify foods with trans fats or high amounts of saturated fat.  Fill one half of your plate with vegetables and green salads. Eat 4-5 servings of vegetables per day. A serving of vegetables is: ? 1 cup of raw leafy vegetables. ?  cup of raw or cooked cut-up vegetables. ?  cup of vegetable juice.  Fill one fourth of your plate with whole grains. Look for the word "whole" as the first word in the ingredient list.  Fill one fourth of your plate with lean protein foods.  Eat 4-5 servings of fruit per day. A serving of fruit is: ? One medium whole fruit. ?  cup of dried fruit. ?  cup of fresh, frozen, or canned fruit. ?  cup of 100% fruit juice.  Eat more foods that contain soluble fiber. These include apples, broccoli, carrots, beans, peas, and barley. Try to get 20-30 g of fiber per day.  Eat more home-cooked food. Eat less restaurant, buffet, and fast food.  Limit or avoid alcohol.  Limit  foods high in starch and sugar.  Avoid fried foods.  Avoid frying your food. Try baking, boiling, grilling, or broiling it instead. You can also reduce fat by: ? Removing the skin from poultry. ? Removing all visible fats from meats. ? Skimming the fat off of stews, soups, and gravies before serving them. ? Steaming vegetables in water or broth.  Lose weight if you are overweight.  Eat 4-5 servings of nuts, legumes, and seeds per week: ? One serving of dried beans or legumes equals  cup after being cooked. ? One serving of nuts equals 1 ounces. ? One serving of seeds equals  ounce or one tablespoon.  You may need to keep track of how much salt or sodium you eat. This is especially true if you have high blood pressure. Talk with your doctor or dietitian to get more information. What foods can I eat? Grains Breads, including [place], white, pita, wheat, raisin, rye, oatmeal, and [place]. Tortillas that are neither fried nor made with lard or trans fat. Low-fat rolls, including hotdog and hamburger buns and English muffins. Biscuits. Muffins. Waffles. Pancakes. Light popcorn. Whole-grain cereals. Flatbread. Melba toast. Pretzels. Breadsticks. Rusks. Low-fat snacks. Low-fat crackers, including oyster, saltine, matzo, graham, animal, and rye. Rice and pasta, including brown rice and pastas that are made with whole wheat. Vegetables All vegetables. Fruits All fruits, but limit coconut. Meats and Other Protein Sources Lean, well-trimmed beef, veal, pork, and lamb. Chicken and [place] without skin. All fish and shellfish. Wild duck, rabbit, pheasant, and venison. Egg whites or low-cholesterol egg substitutes. Dried beans, peas, lentils, and tofu. Seeds and most nuts. Dairy Low-fat or nonfat cheeses, including ricotta, string, and mozzarella. Skim or 1% milk that is liquid, powdered, or evaporated. Buttermilk that is made with low-fat milk. Nonfat or low-fat yogurt. Beverages Mineral water.  Diet carbonated beverages. Sweets and Desserts Sherbets and fruit ices. Honey, jam, marmalade, jelly, and syrups. Meringues and gelatins. Pure sugar candy, such as hard candy, jelly beans, gumdrops, mints, marshmallows, and small amounts of dark chocolate. [employer]. Eat all sweets and desserts in moderation. Fats and Oils Nonhydrogenated (trans-free) margarines. Vegetable oils, including soybean, sesame, sunflower, olive, peanut, safflower, corn, canola, and cottonseed. Salad dressings or mayonnaise made with a  vegetable oil. Limit added fats and oils that you use for cooking, baking, salads, and as spreads. Other Cocoa powder. Coffee and tea. All seasonings and condiments. The items listed above may not be a complete list of recommended foods or beverages. Contact your dietitian for more options. What foods are not recommended? Grains Breads that are made with saturated or trans fats, oils, or whole milk. Croissants. Butter rolls. Cheese breads. Sweet rolls. Donuts. Buttered popcorn. Chow mein noodles. High-fat crackers, such as cheese or butter crackers. Meats and Other Protein Sources Fatty meats, such as hotdogs, short ribs, sausage, spareribs, bacon, rib eye roast or steak, and mutton. High-fat deli meats, such as salami and bologna. Caviar. Domestic duck and goose. Organ meats, such as kidney, liver, sweetbreads, and heart. Dairy Cream, sour cream, cream cheese, and creamed cottage cheese. Whole-milk cheeses, including blue (bleu), [address], [place], [place], [address], [place], [address], cheddar, [place], and [place]. Whole or 2% milk that is liquid, evaporated, or condensed. Whole buttermilk. Cream sauce or high-fat cheese sauce. Yogurt that is made from whole milk. Beverages Regular sodas and juice drinks with added sugar. Sweets and Desserts Frosting. Pudding. Cookies. Cakes other than angel food cake. Candy that has milk chocolate or white chocolate, hydrogenated fat, butter,  coconut, or unknown ingredients. Buttered syrups. Full-fat ice cream or ice cream drinks. Fats and Oils Gravy that has suet, meat fat, or shortening. Cocoa butter, hydrogenated oils, palm oil, coconut oil, palm kernel oil. These can often be found in baked products, candy, fried foods, nondairy creamers, and whipped toppings. Solid fats and shortenings, including bacon fat, salt pork, lard, and butter. Nondairy cream substitutes, such as coffee creamers and sour cream substitutes. Salad dressings that are made of unknown oils, cheese, or sour cream. The items listed above may not be a complete list of foods and beverages to avoid. Contact your dietitian for more information. This information is not intended to replace advice given to you by your health care provider. Make sure you discuss any questions you have with your health care provider. Document Released: 11/04/2011 Document Revised: 10/11/2015 Document Reviewed: 10/27/2013 Elsevier Interactive Patient Education  [employer].

## 2017-02-24 NOTE — H&P (View-Only) (Signed)
Progress Note  Patient Name: Cesar Morales Date of Encounter: 02/24/2017  Primary Cardiologist: Antoine Poche  Subjective   Cesar Morales is a 56 y.o. male with a hx of DM,  who is being seen today for the evaluation of chest pain  at the request of Dr. Catha Gosselin.  roponin levels remained negative. EKG was unremarkable. Inpatient Medications    Scheduled Meds: . aspirin EC  81 mg Oral Daily  . enoxaparin (LOVENOX) injection  40 mg Subcutaneous Q24H  . gabapentin  600 mg Oral TID  . insulin aspart  0-15 Units Subcutaneous TID WC  . isosorbide mononitrate  120 mg Oral Daily  . nitroGLYCERIN  1 inch Topical Q6H  . pantoprazole  40 mg Oral Daily  . simvastatin  20 mg Oral QHS  . sodium chloride flush  3 mL Intravenous Q12H   Continuous Infusions: . sodium chloride    . sodium chloride 100 mL/hr at 02/24/17 0527   PRN Meds: sodium chloride, acetaminophen, albuterol, gi cocktail, morphine injection, nitroGLYCERIN, ondansetron (ZOFRAN) IV, sodium chloride flush   Vital Signs    Vitals:   02/23/17 1900 02/23/17 1954 02/23/17 2345 02/24/17 0510  BP: 123/88 140/87 118/79 113/77  Pulse: 73 (!) 54 62 61  Resp: 13 15 16 18   Temp:  97.9 F (36.6 C) 98 F (36.7 C) 98 F (36.7 C)  TempSrc:  Oral Oral Oral  SpO2: 95% 99% 98% 98%  Weight:  269 lb 14.4 oz (122.4 kg)  272 lb 14.4 oz (123.8 kg)  Height:  6\' 2"  (1.88 m)      Intake/Output Summary (Last 24 hours) at 02/24/17 0823 Last data filed at 02/24/17 0742  Gross per 24 hour  Intake             1465 ml  Output              875 ml  Net              590 ml   Filed Weights   02/23/17 1229 02/23/17 1954 02/24/17 0510  Weight: 272 lb (123.4 kg) 269 lb 14.4 oz (122.4 kg) 272 lb 14.4 oz (123.8 kg)    Telemetry    NSR  - Personally Reviewed  ECG    NSR  - Personally Reviewed  Physical Exam   GEN: No acute distress.   Neck: No JVD Cardiac: RRR, no murmurs, rubs, or gallops.  Respiratory: Clear to auscultation bilaterally. GI:  Soft, nontender, non-distended  MS: No edema; No deformity.   Neuro:  Nonfocal  Psych: Normal affect   Labs    Chemistry Recent Labs Lab 02/23/17 1235 02/23/17 1949  NA 136  --   K 3.6  --   CL 103  --   CO2 22  --   GLUCOSE 117*  --   BUN 10  --   CREATININE 0.99 1.01  CALCIUM 9.1  --   GFRNONAA >60 >60  GFRAA >60 >60  ANIONGAP 11  --      Hematology Recent Labs Lab 02/23/17 1235 02/23/17 1949 02/24/17 0724  WBC 11.7* 11.8* 7.8  RBC 5.28 5.36 4.95  HGB 15.5 15.9 15.0  HCT 46.0 47.0 43.7  MCV 87.1 87.7 88.3  MCH 29.4 29.7 30.3  MCHC 33.7 33.8 34.3  RDW 13.6 13.8 13.8  PLT 239 255 210    Cardiac Enzymes Recent Labs Lab 02/23/17 1750 02/23/17 1949 02/24/17 0210  TROPONINI <0.03 <0.03 <0.03    Recent Labs Lab  02/23/17 1245  TROPIPOC 0.00     BNPNo results for input(s): BNP, PROBNP in the last 168 hours.   DDimer  Recent Labs Lab 02/23/17 1235  DDIMER 0.61*     Radiology    Dg Chest 2 View  Result Date: 02/23/2017 CLINICAL DATA:  Central chest pain and shortness of breath for 1 day. EXAM: CHEST  2 VIEW COMPARISON:  Single-view of the chest 08/08/2015. FINDINGS: The lungs are clear. Heart size is normal. No pneumothorax or pleural fluid. No acute bony abnormality. IMPRESSION: No acute disease. Electronically Signed   By: Thomas  Dalessio M.D.   On: 02/23/2017 13:23   Ct Angio Chest Pe W/cm &/or Wo Cm  Result Date: 02/23/2017 CLINICAL DATA:  Chest pressure and shortness of breath. EXAM: CT ANGIOGRAPHY CHEST WITH CONTRAST TECHNIQUE: Multidetector CT imaging of the chest was performed using the standard protocol during bolus administration of intravenous contrast. Multiplanar CT image reconstructions and MIPs were obtained to evaluate the vascular anatomy. CONTRAST:  100 cc Isovue 370 COMPARISON:  None. FINDINGS: Cardiovascular: Heart is enlarged. Coronary artery calcification is evident. No thoracic aortic aneurysm no filling defect in the opacified  pulmonary arteries to suggest the presence of an acute pulmonary embolus. Mediastinum/Nodes: No mediastinal lymphadenopathy. There is no hilar lymphadenopathy. There is no axillary lymphadenopathy. The esophagus has normal imaging features. Lungs/Pleura: No focal airspace consolidation. No pulmonary edema or pleural effusion. 7 mm pulmonary nodule identified left lower lobe on image 70 series 9. Upper Abdomen: The liver shows diffusely decreased attenuation suggesting steatosis. Musculoskeletal: Bone windows reveal no worrisome lytic or sclerotic osseous lesions. Review of the MIP images confirms the above findings. IMPRESSION: 1. No CT evidence for acute pulmonary embolus. 2. 7 mm left lower lobe pulmonary nodule. Non-contrast chest CT at 6-12 months is recommended. If the nodule is stable at time of repeat CT, then future CT at 18-24 months (from today's scan) is considered optional for low-risk patients, but is recommended for high-risk patients. This recommendation follows the consensus statement: Guidelines for Management of Incidental Pulmonary Nodules Detected on CT Images: From the Fleischner Society 2017; Radiology 2017; 284:228-243. Electronically Signed   By: Eric  Mansell M.D.   On: 02/23/2017 16:58    Cardiac Studies     Patient Profile     56 y.o. male with small diabetic coronaries.  Assessment & Plan    1.   Chest pain :  Symptoms are worrisome but his EKG remains unremarkable and troponins are negative. We discussed heart Cath . We discussed the risks, benefits, and options of heart cath. He understands and agrees to proceed.  He'll be able to be discharged to home from a cardiac standpoint if his cath is unremarkable. We discussed that he will need to stay tonight if he has a stent placed.   For questions or updates, please contact [facility] Please consult [email or web address] for contact info under Cardiology/STEMI.      Signed, Alean Kromer, MD  02/24/2017, 8:23 AM    

## 2017-02-24 NOTE — Progress Notes (Signed)
Progress Note  Patient Name: Cesar Morales Date of Encounter: 02/24/2017  Primary Cardiologist: Antoine Poche  Subjective   Cesar Morales is a 56 y.o. male with a hx of DM,  who is being seen today for the evaluation of chest pain  at the request of Dr. Catha Gosselin.  roponin levels remained negative. EKG was unremarkable. Inpatient Medications    Scheduled Meds: . aspirin EC  81 mg Oral Daily  . enoxaparin (LOVENOX) injection  40 mg Subcutaneous Q24H  . gabapentin  600 mg Oral TID  . insulin aspart  0-15 Units Subcutaneous TID WC  . isosorbide mononitrate  120 mg Oral Daily  . nitroGLYCERIN  1 inch Topical Q6H  . pantoprazole  40 mg Oral Daily  . simvastatin  20 mg Oral QHS  . sodium chloride flush  3 mL Intravenous Q12H   Continuous Infusions: . sodium chloride    . sodium chloride 100 mL/hr at 02/24/17 0527   PRN Meds: sodium chloride, acetaminophen, albuterol, gi cocktail, morphine injection, nitroGLYCERIN, ondansetron (ZOFRAN) IV, sodium chloride flush   Vital Signs    Vitals:   02/23/17 1900 02/23/17 1954 02/23/17 2345 02/24/17 0510  BP: 123/88 140/87 118/79 113/77  Pulse: 73 (!) 54 62 61  Resp: 13 15 16 18   Temp:  97.9 F (36.6 C) 98 F (36.7 C) 98 F (36.7 C)  TempSrc:  Oral Oral Oral  SpO2: 95% 99% 98% 98%  Weight:  269 lb 14.4 oz (122.4 kg)  272 lb 14.4 oz (123.8 kg)  Height:  6\' 2"  (1.88 m)      Intake/Output Summary (Last 24 hours) at 02/24/17 0823 Last data filed at 02/24/17 0742  Gross per 24 hour  Intake             1465 ml  Output              875 ml  Net              590 ml   Filed Weights   02/23/17 1229 02/23/17 1954 02/24/17 0510  Weight: 272 lb (123.4 kg) 269 lb 14.4 oz (122.4 kg) 272 lb 14.4 oz (123.8 kg)    Telemetry    NSR  - Personally Reviewed  ECG    NSR  - Personally Reviewed  Physical Exam   GEN: No acute distress.   Neck: No JVD Cardiac: RRR, no murmurs, rubs, or gallops.  Respiratory: Clear to auscultation bilaterally. GI:  Soft, nontender, non-distended  MS: No edema; No deformity.   Neuro:  Nonfocal  Psych: Normal affect   Labs    Chemistry Recent Labs Lab 02/23/17 1235 02/23/17 1949  NA 136  --   K 3.6  --   CL 103  --   CO2 22  --   GLUCOSE 117*  --   BUN 10  --   CREATININE 0.99 1.01  CALCIUM 9.1  --   GFRNONAA >60 >60  GFRAA >60 >60  ANIONGAP 11  --      Hematology Recent Labs Lab 02/23/17 1235 02/23/17 1949 02/24/17 0724  WBC 11.7* 11.8* 7.8  RBC 5.28 5.36 4.95  HGB 15.5 15.9 15.0  HCT 46.0 47.0 43.7  MCV 87.1 87.7 88.3  MCH 29.4 29.7 30.3  MCHC 33.7 33.8 34.3  RDW 13.6 13.8 13.8  PLT 239 255 210    Cardiac Enzymes Recent Labs Lab 02/23/17 1750 02/23/17 1949 02/24/17 0210  TROPONINI <0.03 <0.03 <0.03    Recent Labs Lab  02/23/17 1245  TROPIPOC 0.00     BNPNo results for input(s): BNP, PROBNP in the last 168 hours.   DDimer  Recent Labs Lab 02/23/17 1235  DDIMER 0.61*     Radiology    Dg Chest 2 View  Result Date: 02/23/2017 CLINICAL DATA:  Central chest pain and shortness of breath for 1 day. EXAM: CHEST  2 VIEW COMPARISON:  Single-view of the chest 08/08/2015. FINDINGS: The lungs are clear. Heart size is normal. No pneumothorax or pleural fluid. No acute bony abnormality. IMPRESSION: No acute disease. Electronically Signed   By: Drusilla Kanner M.D.   On: 02/23/2017 13:23   Ct Angio Chest Pe W/cm &/or Wo Cm  Result Date: 02/23/2017 CLINICAL DATA:  Chest pressure and shortness of breath. EXAM: CT ANGIOGRAPHY CHEST WITH CONTRAST TECHNIQUE: Multidetector CT imaging of the chest was performed using the standard protocol during bolus administration of intravenous contrast. Multiplanar CT image reconstructions and MIPs were obtained to evaluate the vascular anatomy. CONTRAST:  100 cc Isovue 370 COMPARISON:  None. FINDINGS: Cardiovascular: Heart is enlarged. Coronary artery calcification is evident. No thoracic aortic aneurysm no filling defect in the opacified  pulmonary arteries to suggest the presence of an acute pulmonary embolus. Mediastinum/Nodes: No mediastinal lymphadenopathy. There is no hilar lymphadenopathy. There is no axillary lymphadenopathy. The esophagus has normal imaging features. Lungs/Pleura: No focal airspace consolidation. No pulmonary edema or pleural effusion. 7 mm pulmonary nodule identified left lower lobe on image 70 series 9. Upper Abdomen: The liver shows diffusely decreased attenuation suggesting steatosis. Musculoskeletal: Bone windows reveal no worrisome lytic or sclerotic osseous lesions. Review of the MIP images confirms the above findings. IMPRESSION: 1. No CT evidence for acute pulmonary embolus. 2. 7 mm left lower lobe pulmonary nodule. Non-contrast chest CT at 6-12 months is recommended. If the nodule is stable at time of repeat CT, then future CT at 18-24 months (from today's scan) is considered optional for low-risk patients, but is recommended for high-risk patients. This recommendation follows the consensus statement: Guidelines for Management of Incidental Pulmonary Nodules Detected on CT Images: From the Fleischner Society 2017; Radiology 2017; 284:228-243. Electronically Signed   By: Kennith Center M.D.   On: 02/23/2017 16:58    Cardiac Studies     Patient Profile     56 y.o. male with small diabetic coronaries.  Assessment & Plan    1.   Chest pain :  Symptoms are worrisome but his EKG remains unremarkable and troponins are negative. We discussed heart Cath . We discussed the risks, benefits, and options of heart cath. He understands and agrees to proceed.  He'll be able to be discharged to home from a cardiac standpoint if his cath is unremarkable. We discussed that he will need to stay tonight if he has a stent placed.   For questions or updates, please contact [facility] Please consult [email or web address] for contact info under Cardiology/STEMI.      Signed, Kristeen Miss, MD  02/24/2017, 8:23 AM

## 2017-02-24 NOTE — Interval H&P Note (Signed)
History and Physical Interval Note:  02/24/2017 9:27 AM  Cesar Morales  has presented today for surgery, with the diagnosis of unstable angina.  The various methods of treatment have been discussed with the patient and family. After consideration of risks, benefits and other options for treatment, the patient has consented to  Procedure(s): LEFT HEART CATH AND CORONARY ANGIOGRAPHY (N/A) as a surgical intervention .  The patient's history has been reviewed, patient examined, no change in status, stable for surgery.  I have reviewed the patient's chart and labs.  Questions were answered to the patient's satisfaction.     Cath Lab Visit (complete for each Cath Lab visit)  Clinical Evaluation Leading to the Procedure:   ACS: Yes.    Non-ACS:    Anginal Classification: CCS IV  Anti-ischemic medical therapy: Minimal Therapy (1 class of medications)  Non-Invasive Test Results: No non-invasive testing performed  Prior CABG: No previous CABG  Bryan Lemma

## 2017-02-24 NOTE — Discharge Summary (Signed)
Physician Discharge Summary  Cesar Morales TDH:[id number] DOB: Dec 26, 1960 DOA: 02/23/2017  PCP: Junie Spencer, FNP  Admit date: 02/23/2017 Discharge date: 02/24/2017  Time spent: 45 minutes  Recommendations for Outpatient Follow-up:  Patient will be discharged to home.  Patient will need to follow up with primary care provider within one week of discharge and discuss lifestyle modifications and smoking cessation. You will need a repeat CT scan of your chest in 6-12 months. Follow up with Dr. Antoine Poche, cardiology. Patient should continue medications as prescribed.  Patient should follow a heart healthy/carb modified diet. Restart your metformin on 02/27/2017.  Discharge Diagnoses:  Chest pain, ruled out ACS Diabetes mellitus, type II with neuropathy Left lobe pulmonary nodule GERD Hyperlipidemia Essential hypertension Abnormal TSH Obesity Tobacco abuse  Discharge Condition: Stable   Diet recommendation: Heart healthy/carb modified   Filed Weights   02/23/17 1229 02/23/17 1954 02/24/17 0510  Weight: 123.4 kg (272 lb) 122.4 kg (269 lb 14.4 oz) 123.8 kg (272 lb 14.4 oz)    History of present illness:  On 02/23/2017 Cesar Morales is a 56 y.o. male with a medical history of coronary artery disease, diabetes, hypertension who presented to emergency room with completes of chest pain, which started this morning at approximately 3 AM. Patient woke up from sleep stating he was in a cold sweat. He states his pain has been centrally located however radiates to the left side of his neck. Pain is somewhat alleviated with morphine and nitroglycerin however has not completely gone away. Patient states the pain is like an "elephant sitting on his chest". Patient also complained of shortness of breath along with nausea and one episode of vomiting. Patient states that he did have a catheterization back in [place] before moving to [place] and was told he has a small arteries. Patient denies any  recent travel, ill contacts, headache, dizziness.  Hospital Course:  Chest pain, ruled out ACS -Troponin cycled and unremarkable  -CTA Chest negative for PE -Echocardiogram 08/09/2015 EF 60-65%  -patient follows with Dr. Antoine Poche  -Lipid panel: TC 172, HDL 28, LDL 97, TG 236 -magnesium 1.9, phos 3.6 -TSH 4.795, pending FT4 (may be followed up on by PCP) -Continue aspirin, statin -was placed on morphine and nitro -Cardiology consulted and appreciated  -s/p cardiac catheterization: Ost 1st Mrg to 1st Mrg lesion, 85 %stenosed.  Ost RPDA to RPDA lesion, 35 %stenosed.  Mid LAD-1 lesion, 20 %stenosed. Mid LAD-2 lesion, 35 %stenosed.  Ost 2nd Mrg lesion, 40 %stenosed.  There is mild left ventricular systolic dysfunction.  The left ventricular ejection fraction is 45-50% by visual estimate. Cannot exclude mild anterior hypokinesis - suggest 2 D Echo.  Does not have small arteries. Large patulous vessels, but what may be causing symptoms is small caliber OM1 branch which does appear to have severe lesion proximally. Unfortunately this is an ostial lesion all branches that therefore not a good PCI target.  -Cardiology felt that his chest pain is nonanginal.  -will discharge with nitroSL -needs to follow up with cardiology and PCP  -echocardiogram can be done as an outpatient  -Discussed smoking cessation and weight management  -continue imdur  Diabetes mellitus, type II with neuropathy -Hold metformin, glipizide -continue to hold metformin as patient had IV contrast for CTA and catheterization- may resume on 02/27/2017 -Resume glipizide at discharge -Was placed on ISS and CBG monitoring  -continue gabapentin for neuropathy  Left lobe pulmonary nodule  -64mm nodule noted CT chest, recommended non-contrast CT 6-10months  GERD -  continue PPI   Hyperlipidemia -continue statin  Essential hypertension -hold chlorthalidone- may resume upon discharge   Abnormal TSH -As above  TSH 4.795, FT4 pending, may be followed by PCP -may need repeat testing in 4-6 weeks  Obesity -BMI >35 -discuss lifestyle modifications with PCP  Tobacco abuse -dicussed smoking cessation -patient smokes 1ppd  Procedures: Cardiac catheterization/Left heart cath and coronary angiography   Consultations: Cardiology  Discharge Exam: Vitals:   02/24/17 1146 02/24/17 1155  BP: 103/75   Pulse:    Resp:    Temp:  (!) 97.5 F (36.4 C)  SpO2: 95%    Patient continues to complain of chest pain, and states that morphine and nitro help some but the pain never goes away. Denis current shortness of breath, abdominal pain, N/V/D/C, dizziness, headache.    General: Well developed, well nourished, NAD, appears stated age  HEENT: NCAT, mucous membranes moist.  Cardiovascular: S1 S2 auscultated, RRR, no murmurs  Respiratory: Clear to auscultation bilaterally with equal chest rise  Abdomen: Soft, obese, nontender, nondistended, + bowel sounds  Extremities: warm dry without cyanosis clubbing or edema  Neuro: AAOx3, nonfocal   Skin: Without rashes exudates or nodules  Psych: Flat affect, but appropriate  Discharge Instructions Discharge Instructions    Discharge instructions    Complete by:  As directed    Patient will be discharged to home.  Patient will need to follow up with primary care provider within one week of discharge and discuss lifestyle modifications and smoking cessation. You will need a repeat CT scan of your chest in 6-12 months. Follow up with Dr. Antoine Poche, cardiology. Patient should continue medications as prescribed.  Patient should follow a heart healthy/carb modified diet. Restart your metformin on 02/27/2017.     Current Discharge Medication List    START taking these medications   Details  nitroGLYCERIN (NITROSTAT) 0.4 MG SL tablet Place 1 tablet (0.4 mg total) under the tongue every 5 (five) minutes as needed for chest pain. Qty: 25 tablet, Refills: 1        CONTINUE these medications which have NOT CHANGED   Details  albuterol (PROVENTIL HFA;VENTOLIN HFA) 108 (90 Base) MCG/ACT inhaler Inhale 2 puffs into the lungs every 6 (six) hours as needed for wheezing or shortness of breath.    aspirin EC 81 MG tablet Take 81 mg by mouth daily.    dexlansoprazole (DEXILANT) 60 MG capsule Take 1 capsule (60 mg total) by mouth daily. Qty: 90 capsule, Refills: 1   Associated Diagnoses: Gastroesophageal reflux disease, esophagitis presence not specified    glipiZIDE (GLUCOTROL) 5 MG tablet Take 1 tablet (5 mg total) by mouth daily before breakfast. Qty: 90 tablet, Refills: 1   Associated Diagnoses: Diabetes mellitus without complication (HCC)    isosorbide mononitrate (IMDUR) 120 MG 24 hr tablet Take 1 tablet (120 mg total) by mouth daily. Qty: 30 tablet, Refills: 6    simvastatin (ZOCOR) 20 MG tablet Take 1 tablet (20 mg total) by mouth at bedtime. Qty: 90 tablet, Refills: 3    chlorthalidone (HYGROTON) 25 MG tablet TAKE 1 TABLET DAILY Qty: 90 tablet, Refills: 0    gabapentin (NEURONTIN) 600 MG tablet TAKE (1) TABLET THREE TIMES DAILY. Qty: 270 tablet, Refills: 0   Associated Diagnoses: Diabetic peripheral neuropathy (HCC)    metFORMIN (GLUCOPHAGE) 500 MG tablet TAKE  (1)  TABLET TWICE A DAY WITH MEALS (BREAKFAST AND SUPPER) Qty: 180 tablet, Refills: 0   Associated Diagnoses: Diabetes mellitus without complication (HCC)  STOP taking these medications     ibuprofen (ADVIL,MOTRIN) 200 MG tablet        Allergies  Allergen Reactions  . Lisinopril Swelling    angioedema  . Lipitor [Atorvastatin]     Stomach cramps   Follow-up Information    Barrett, Joline Salt, PA-C Follow up on 03/10/2017.   Specialties:  Cardiology, Radiology Why:  2:30 pm for hospital follow up Contact information: [address] [place] [address] [phone]        Junie Spencer, FNP. Schedule an appointment as soon as possible for a  visit in 1 week(s).   Specialty:  Family Medicine Why:  Hospital follow up Contact information: [address] [place] [address] [phone]            The results of significant diagnostics from this hospitalization (including imaging, microbiology, ancillary and laboratory) are listed below for reference.    Significant Diagnostic Studies: Dg Chest 2 View  Result Date: 02/23/2017 CLINICAL DATA:  Central chest pain and shortness of breath for 1 day. EXAM: CHEST  2 VIEW COMPARISON:  Single-view of the chest 08/08/2015. FINDINGS: The lungs are clear. Heart size is normal. No pneumothorax or pleural fluid. No acute bony abnormality. IMPRESSION: No acute disease. Electronically Signed   By: Drusilla Kanner M.D.   On: 02/23/2017 13:23   Ct Angio Chest Pe W/cm &/or Wo Cm  Result Date: 02/23/2017 CLINICAL DATA:  Chest pressure and shortness of breath. EXAM: CT ANGIOGRAPHY CHEST WITH CONTRAST TECHNIQUE: Multidetector CT imaging of the chest was performed using the standard protocol during bolus administration of intravenous contrast. Multiplanar CT image reconstructions and MIPs were obtained to evaluate the vascular anatomy. CONTRAST:  100 cc Isovue 370 COMPARISON:  None. FINDINGS: Cardiovascular: Heart is enlarged. Coronary artery calcification is evident. No thoracic aortic aneurysm no filling defect in the opacified pulmonary arteries to suggest the presence of an acute pulmonary embolus. Mediastinum/Nodes: No mediastinal lymphadenopathy. There is no hilar lymphadenopathy. There is no axillary lymphadenopathy. The esophagus has normal imaging features. Lungs/Pleura: No focal airspace consolidation. No pulmonary edema or pleural effusion. 7 mm pulmonary nodule identified left lower lobe on image 70 series 9. Upper Abdomen: The liver shows diffusely decreased attenuation suggesting steatosis. Musculoskeletal: Bone windows reveal no worrisome lytic or sclerotic osseous lesions. Review of  the MIP images confirms the above findings. IMPRESSION: 1. No CT evidence for acute pulmonary embolus. 2. 7 mm left lower lobe pulmonary nodule. Non-contrast chest CT at 6-12 months is recommended. If the nodule is stable at time of repeat CT, then future CT at 18-24 months (from today's scan) is considered optional for low-risk patients, but is recommended for high-risk patients. This recommendation follows the consensus statement: Guidelines for Management of Incidental Pulmonary Nodules Detected on CT Images: From the Fleischner Society 2017; Radiology 2017; 284:228-243. Electronically Signed   By: Kennith Center M.D.   On: 02/23/2017 16:58    Microbiology: Recent Results (from the past 240 hour(s))  MRSA PCR Screening     Status: None   Collection Time: 02/23/17  8:01 PM  Result Value Ref Range Status   MRSA by PCR NEGATIVE NEGATIVE Final    Comment:        The GeneXpert MRSA Assay (FDA approved for NASAL specimens only), is one component of a comprehensive MRSA colonization surveillance program. It is not intended to diagnose MRSA infection nor to guide or monitor treatment for MRSA infections.  Labs: Basic Metabolic Panel:  Recent Labs Lab 02/23/17 1235 02/23/17 1949 02/24/17 0724  NA 136  --  137  K 3.6  --  3.8  CL 103  --  104  CO2 22  --  23  GLUCOSE 117*  --  106*  BUN 10  --  12  CREATININE 0.99 1.01 0.99  CALCIUM 9.1  --  8.5*  MG  --  1.9  --   PHOS  --  3.6  --    Liver Function Tests: No results for input(s): AST, ALT, ALKPHOS, BILITOT, PROT, ALBUMIN in the last 168 hours. No results for input(s): LIPASE, AMYLASE in the last 168 hours. No results for input(s): AMMONIA in the last 168 hours. CBC:  Recent Labs Lab 02/23/17 1235 02/23/17 1949 02/24/17 0724  WBC 11.7* 11.8* 7.8  HGB 15.5 15.9 15.0  HCT 46.0 47.0 43.7  MCV 87.1 87.7 88.3  PLT 239 255 210   Cardiac Enzymes:  Recent Labs Lab 02/23/17 1750 02/23/17 1949 02/24/17 0210  02/24/17 0724  TROPONINI <0.03 <0.03 <0.03 <0.03   BNP: BNP (last 3 results) No results for input(s): BNP in the last 8760 hours.  ProBNP (last 3 results) No results for input(s): PROBNP in the last 8760 hours.  CBG:  Recent Labs Lab 02/23/17 2154 02/24/17 0743 02/24/17 1128  GLUCAP 184* 109* 106*       Signed:  Gerardine Peltz  [facility] 02/24/2017, 1:08 PM

## 2017-02-25 MED FILL — Lidocaine HCl Local Inj 2%: INTRAMUSCULAR | Qty: 10 | Status: AC

## 2017-03-10 ENCOUNTER — Ambulatory Visit: Payer: 59 | Admitting: Physician Assistant

## 2017-03-30 ENCOUNTER — Telehealth: Payer: Self-pay | Admitting: Cardiology

## 2017-03-30 NOTE — Telephone Encounter (Signed)
    Chart reviewed as part of pre-operative protocol coverage. Because of Rasheen Dearman's past medical history and time since last visit, he/she will require a follow-up visit in order to better assess preoperative cardiovascular risk. Last OV 2017. Per schedulers note they have already scheduled patient appt to see Franky Macho this week. Will remove from pre-op pool. Cc'ing to make [facility] aware of appt.  Laurann Montana, PA-C  03/30/2017, 4:18 PM

## 2017-03-30 NOTE — Telephone Encounter (Signed)
Follow up  Marylene Land - worker comp case manager schedule appt [phone]  I scheduled him with Corine Shelter 04/02/17  130p

## 2017-03-30 NOTE — Telephone Encounter (Signed)
New Message         [facility] Pre-operative Risk Assessment    Request for surgical clearance:  1. What type of surgery is being performed?  Left orthoscopic rotator cup  2. When is this surgery scheduled? Next week   3. Are there any medications that need to be held prior to surgery and how long? Coumadin   4. Practice name and name of physician performing surgery? Dr Larkin Ina chandler    5. What is your office phone and fax number? [phone] fax [phone]  6. Anesthesia type (None, local, MAC, general) ? General    Howie Ill 03/30/2017, 3:59 PM  _________________________________________________________________   (provider comments below)

## 2017-03-31 NOTE — Telephone Encounter (Signed)
Faxed via EPIC

## 2017-03-31 NOTE — Telephone Encounter (Signed)
[place] appt 04-02-17 for clearance

## 2017-04-02 ENCOUNTER — Ambulatory Visit (INDEPENDENT_AMBULATORY_CARE_PROVIDER_SITE_OTHER): Payer: Self-pay | Admitting: Cardiology

## 2017-04-02 ENCOUNTER — Encounter: Payer: Self-pay | Admitting: Cardiology

## 2017-04-02 VITALS — BP 110/70 | HR 95 | Ht 72.0 in | Wt 276.8 lb

## 2017-04-02 DIAGNOSIS — I200 Unstable angina: Secondary | ICD-10-CM

## 2017-04-02 DIAGNOSIS — I10 Essential (primary) hypertension: Secondary | ICD-10-CM

## 2017-04-02 DIAGNOSIS — Z8249 Family history of ischemic heart disease and other diseases of the circulatory system: Secondary | ICD-10-CM

## 2017-04-02 DIAGNOSIS — I25110 Atherosclerotic heart disease of native coronary artery with unstable angina pectoris: Secondary | ICD-10-CM

## 2017-04-02 DIAGNOSIS — F17200 Nicotine dependence, unspecified, uncomplicated: Secondary | ICD-10-CM

## 2017-04-02 DIAGNOSIS — Z01810 Encounter for preprocedural cardiovascular examination: Secondary | ICD-10-CM

## 2017-04-02 DIAGNOSIS — R9389 Abnormal findings on diagnostic imaging of other specified body structures: Secondary | ICD-10-CM

## 2017-04-02 DIAGNOSIS — E669 Obesity, unspecified: Secondary | ICD-10-CM

## 2017-04-02 DIAGNOSIS — E785 Hyperlipidemia, unspecified: Secondary | ICD-10-CM

## 2017-04-02 MED ORDER — ROSUVASTATIN CALCIUM 20 MG PO TABS: 20.0000 mg | ORAL_TABLET | Freq: Every day | ORAL | 3 refills | Status: DC

## 2017-04-02 NOTE — Assessment & Plan Note (Signed)
Seen today for pre op clearance prior to shoulder surgery 

## 2017-04-02 NOTE — Progress Notes (Signed)
c 

## 2017-04-02 NOTE — Progress Notes (Signed)
04/02/2017 Georgina Snell   [birth year]  [id number]  Primary Physician Junie Spencer, FNP Primary Cardiologist: Dr Antoine Poche   HPI:  56 y/o male followed by Dr Antoine Poche with a history of chest pain. He had a cath in [place] in 2016 that showed small, normal coronaries. He has multiple cardiac risk factors including a FM Hx, DM, HTN, HLD, and smoking. He drives a truck locally for work. He was recently admitted to [facility] 10/8-10/9/18 with chest pain concerning for unstable angina. He ruled out and it was decided to proceed with cath. This revealed an 85% small OM1, mild distal RCA disease, and normal LVF. Plan is for medical Rx and risk factor modification.   He is in the office today for pre op clearance prior to Rt shoulder repair. He has not had any chest pain that sounds like angina since he was hospitalized. He has chronic DOE, he is still smoking but has cut way back, < 5 a day. He is able to go up and down stairs and walk around the block without chest pain.    Current Outpatient Medications  Medication Sig Dispense Refill  . albuterol (PROVENTIL HFA;VENTOLIN HFA) 108 (90 Base) MCG/ACT inhaler Inhale 2 puffs into the lungs every 6 (six) hours as needed for wheezing or shortness of breath.    Marland Kitchen aspirin EC 81 MG tablet Take 81 mg by mouth daily.    . chlorthalidone (HYGROTON) 25 MG tablet TAKE 1 TABLET DAILY 90 tablet 0  . dexlansoprazole (DEXILANT) 60 MG capsule Take 1 capsule (60 mg total) by mouth daily. 90 capsule 1  . gabapentin (NEURONTIN) 600 MG tablet TAKE (1) TABLET THREE TIMES DAILY. 270 tablet 0  . glipiZIDE (GLUCOTROL) 5 MG tablet Take 1 tablet (5 mg total) by mouth daily before breakfast. 90 tablet 1  . isosorbide mononitrate (IMDUR) 120 MG 24 hr tablet Take 1 tablet (120 mg total) by mouth daily. 30 tablet 6  . metFORMIN (GLUCOPHAGE) 500 MG tablet TAKE  (1)  TABLET TWICE A DAY WITH MEALS (BREAKFAST AND SUPPER) 180 tablet 0  . nitroGLYCERIN (NITROSTAT) 0.4 MG SL tablet  Place 1 tablet (0.4 mg total) under the tongue every 5 (five) minutes as needed for chest pain. 25 tablet 1  . traZODone (DESYREL) 100 MG tablet TAKE ONE TABLET AT BEDTIME 30 tablet 0  . rosuvastatin (CRESTOR) 20 MG tablet Take 1 tablet (20 mg total) daily by mouth. 90 tablet 3   No current facility-administered medications for this visit.     Allergies  Allergen Reactions  . Lisinopril Swelling    angioedema  . Lipitor [Atorvastatin]     Stomach cramps    Past Medical History:  Diagnosis Date  . COPD (chronic obstructive pulmonary disease) (HCC)   . Coronary artery disease   . Diabetes mellitus without complication (HCC)   . Dyslipidemia   . HTN (hypertension)     Social History   Socioeconomic History  . Marital status: Legally Separated    Spouse name: Not on file  . Number of children: Not on file  . Years of education: Not on file  . Highest education level: Not on file  Social Needs  . Financial resource strain: Not on file  . Food insecurity - worry: Not on file  . Food insecurity - inability: Not on file  . Transportation needs - medical: Not on file  . Transportation needs - non-medical: Not on file  Occupational History  . Not on  file  Tobacco Use  . Smoking status: Current Every Day Smoker    Packs/day: 0.50  . Smokeless tobacco: Never Used  Substance and Sexual Activity  . Alcohol use: No  . Drug use: No  . Sexual activity: Not on file  Other Topics Concern  . Not on file  Social History Narrative  . Not on file     Family History  Problem Relation Age of Onset  . Heart disease Mother 88       Died age 31  . Heart disease Father        Atrial fib  . Cancer Father      Review of Systems: General: negative for chills, fever, night sweats or weight changes.  Cardiovascular: negative for chest pain, dyspnea on exertion, edema, orthopnea, palpitations, paroxysmal nocturnal dyspnea or shortness of breath Dermatological: negative for  rash Respiratory: negative for cough or wheezing Urologic: negative for hematuria Abdominal: negative for nausea, vomiting, diarrhea, bright red blood per rectum, melena, or hematemesis Neurologic: negative for visual changes, syncope, or dizziness All other systems reviewed and are otherwise negative except as noted above.    Blood pressure 110/70, pulse 95, height 6' (1.829 m), weight 276 lb 12.8 oz (125.6 kg), SpO2 96 %.  General appearance: alert, cooperative, no distress and moderately obese Neck: no carotid bruit and no JVD Lungs: clear to auscultation bilaterally Heart: regular rate and rhythm Extremities: extremities normal, atraumatic, no cyanosis or edema Skin: Skin color, texture, turgor normal. No rashes or lesions Neurologic: Grossly normal  EKG 02/23/17- NSR  ASSESSMENT AND PLAN:   Pre-operative cardiovascular examination Seen today for pre op clearance prior to shoulder surgery  Coronary artery disease Cath in 2016- small normal coronaries [facility] [place]) Cath Oct 2018 - 85% OM1, mild distal RCA disease. Plan is for medical Rx  Dyslipidemia H/O Lipitor intolerance, recent LDL > 70 on Zocor 20 mg  Essential hypertension Controlled  Family history of coronary artery disease Parents had MI in they're 36's  Smoker He has gone from 2 ppd to < 1/2 ppd  Abnormal chest CT Nodule on chest CT- He needs a f/u chest CT in April 2019   PLAN  Mr Mccombie is a class 2 risk for surgery based on his ischemic heart disease. He has a 0.9% risk of an adverse cardiac event with surgery. He is cleared from a cardiac standpoint without further work up.   I suggested we change his Zocor 20 mg to Crestor 20 mg. His LDL goal is less than 70. This will hopefully give him better lipid control. He was intolerant to Lipitor and Zocor can't be adjusted secondary to multiple drug interactions.  at higher doses. Check lipids in 2 months, f/u OV in 6 months.   I'll defer chest CT f/u to  his PCP.   Corine Shelter PA-C 04/02/2017 2:10 PM

## 2017-04-02 NOTE — Assessment & Plan Note (Signed)
H/O Lipitor intolerance, recent LDL > 70 on Zocor 20 mg

## 2017-04-02 NOTE — Patient Instructions (Addendum)
Medication Instructions:  STOP- Simvastatin START- Crestor 20 mg daily  If you need a refill on your cardiac medications before your next appointment, please call your pharmacy.  Labwork: Fasting Lipids and Liver   Testing/Procedures: None Ordered   Follow-Up: Your physician wants you to follow-up in: 6 Months with Corine Shelter. You should receive a reminder letter in the mail two months in advance. If you do not receive a letter, please call our office [phone].  Thank you for choosing [facility] at [facility]!!

## 2017-04-02 NOTE — Assessment & Plan Note (Signed)
Nodule on chest CT- He needs a f/u chest CT in April 2019

## 2017-04-02 NOTE — Assessment & Plan Note (Signed)
Parents had MI in they're 50's

## 2017-04-02 NOTE — Assessment & Plan Note (Signed)
Controlled.  

## 2017-04-02 NOTE — Assessment & Plan Note (Signed)
Cath in 2016- small normal coronaries [facility] [place]) Cath Oct 2018 - 85% OM1, mild distal RCA disease. Plan is for medical Rx

## 2017-04-02 NOTE — Assessment & Plan Note (Signed)
He has gone from 2 ppd to < 1/2 ppd

## 2017-04-02 NOTE — Assessment & Plan Note (Deleted)
85% OM1

## 2017-04-06 ENCOUNTER — Other Ambulatory Visit: Payer: Self-pay | Admitting: Family

## 2017-04-06 ENCOUNTER — Other Ambulatory Visit: Payer: Self-pay | Admitting: Cardiology

## 2017-04-15 ENCOUNTER — Telehealth: Payer: Self-pay | Admitting: Cardiology

## 2017-04-15 NOTE — Telephone Encounter (Signed)
Left detailed message on Cesar Morales's name verified voicemail at [facility]. Advised that she call back with questions/concerns.

## 2017-04-15 NOTE — Telephone Encounter (Signed)
Ideally, he should continue aspirin through the surgery given the small vessel disease on recent cath. Check with orthopedic surgery to see if he must come off the aspirin prior to the surgery.  Ramond Dial PA Pager: [phone]

## 2017-04-15 NOTE — Telephone Encounter (Signed)
New message   Cesar Morales is calling from [facility]  She said that pt needs his asprin addressed for his surgery  She said he has already been approved by [place] 04/02/2017  PLAN  Mr Rockhill is a class 2 risk for surgery based on his ischemic heart disease. He has a 0.9% risk of an adverse cardiac event with surgery. He is cleared from a cardiac standpoint without further work up.

## 2017-06-02 ENCOUNTER — Other Ambulatory Visit: Payer: Self-pay | Admitting: Cardiology

## 2017-06-23 ENCOUNTER — Other Ambulatory Visit: Payer: Self-pay | Admitting: Family

## 2017-06-23 DIAGNOSIS — E1142 Type 2 diabetes mellitus with diabetic polyneuropathy: Secondary | ICD-10-CM

## 2017-06-25 ENCOUNTER — Other Ambulatory Visit: Payer: Self-pay | Admitting: Family

## 2017-06-25 DIAGNOSIS — E119 Type 2 diabetes mellitus without complications: Secondary | ICD-10-CM

## 2017-07-04 ENCOUNTER — Emergency Department (HOSPITAL_COMMUNITY): Payer: Self-pay

## 2017-07-04 ENCOUNTER — Other Ambulatory Visit: Payer: Self-pay

## 2017-07-04 ENCOUNTER — Emergency Department (HOSPITAL_COMMUNITY)
Admission: EM | Admit: 2017-07-04 | Discharge: 2017-07-04 | Disposition: A | Discharge status: Home / Self Care | Payer: Self-pay | Attending: Emergency Medicine | Admitting: Emergency Medicine

## 2017-07-04 ENCOUNTER — Encounter (HOSPITAL_COMMUNITY): Payer: Self-pay

## 2017-07-04 DIAGNOSIS — E785 Hyperlipidemia, unspecified: Secondary | ICD-10-CM | POA: Insufficient documentation

## 2017-07-04 DIAGNOSIS — N410 Acute prostatitis: Secondary | ICD-10-CM | POA: Insufficient documentation

## 2017-07-04 DIAGNOSIS — I10 Essential (primary) hypertension: Secondary | ICD-10-CM | POA: Insufficient documentation

## 2017-07-04 DIAGNOSIS — E119 Type 2 diabetes mellitus without complications: Secondary | ICD-10-CM | POA: Insufficient documentation

## 2017-07-04 DIAGNOSIS — Z7982 Long term (current) use of aspirin: Secondary | ICD-10-CM | POA: Insufficient documentation

## 2017-07-04 DIAGNOSIS — Z7984 Long term (current) use of oral hypoglycemic drugs: Secondary | ICD-10-CM | POA: Insufficient documentation

## 2017-07-04 DIAGNOSIS — R339 Retention of urine, unspecified: Secondary | ICD-10-CM | POA: Insufficient documentation

## 2017-07-04 DIAGNOSIS — J449 Chronic obstructive pulmonary disease, unspecified: Secondary | ICD-10-CM | POA: Insufficient documentation

## 2017-07-04 DIAGNOSIS — Z79899 Other long term (current) drug therapy: Secondary | ICD-10-CM | POA: Insufficient documentation

## 2017-07-04 DIAGNOSIS — K5900 Constipation, unspecified: Secondary | ICD-10-CM | POA: Insufficient documentation

## 2017-07-04 DIAGNOSIS — K649 Unspecified hemorrhoids: Secondary | ICD-10-CM | POA: Insufficient documentation

## 2017-07-04 DIAGNOSIS — F17200 Nicotine dependence, unspecified, uncomplicated: Secondary | ICD-10-CM | POA: Insufficient documentation

## 2017-07-04 DIAGNOSIS — I2510 Atherosclerotic heart disease of native coronary artery without angina pectoris: Secondary | ICD-10-CM | POA: Insufficient documentation

## 2017-07-04 LAB — URINALYSIS, ROUTINE W REFLEX MICROSCOPIC
Bacteria, UA: NONE SEEN
Bilirubin Urine: NEGATIVE
GLUCOSE, UA: NEGATIVE mg/dL
KETONES UR: 5 mg/dL — AB
LEUKOCYTES UA: NEGATIVE
Nitrite: NEGATIVE
PH: 7 (ref 5.0–8.0)
Protein, ur: NEGATIVE mg/dL
SQUAMOUS EPITHELIAL / LPF: NONE SEEN
Specific Gravity, Urine: >1.046 — ABNORMAL HIGH (ref 1.005–1.030)

## 2017-07-04 LAB — CBC
HEMATOCRIT: 50.3 % (ref 39.0–52.0)
HEMOGLOBIN: 17.7 g/dL — AB (ref 13.0–17.0)
MCH: 30.8 pg (ref 26.0–34.0)
MCHC: 35.2 g/dL (ref 30.0–36.0)
MCV: 87.6 fL (ref 78.0–100.0)
Platelets: 255 10*3/uL (ref 150–400)
RBC: 5.74 MIL/uL (ref 4.22–5.81)
RDW: 13.7 % (ref 11.5–15.5)
WBC: 13 10*3/uL — ABNORMAL HIGH (ref 4.0–10.5)

## 2017-07-04 LAB — LIPASE, BLOOD: Lipase: 31 U/L (ref 11–51)

## 2017-07-04 LAB — COMPREHENSIVE METABOLIC PANEL
ALBUMIN: 4.4 g/dL (ref 3.5–5.0)
ALT: 26 U/L (ref 17–63)
ANION GAP: 15 (ref 5–15)
AST: 26 U/L (ref 15–41)
Alkaline Phosphatase: 105 U/L (ref 38–126)
BILIRUBIN TOTAL: 1.3 mg/dL — AB (ref 0.3–1.2)
BUN: 14 mg/dL (ref 6–20)
CHLORIDE: 104 mmol/L (ref 101–111)
CO2: 18 mmol/L — AB (ref 22–32)
Calcium: 9.6 mg/dL (ref 8.9–10.3)
Creatinine, Ser: 1.05 mg/dL (ref 0.61–1.24)
GFR calc Af Amer: >60 mL/min (ref >60)
GFR calc non Af Amer: >60 mL/min (ref >60)
GLUCOSE: 132 mg/dL — AB (ref 65–99)
Potassium: 3.5 mmol/L (ref 3.5–5.1)
SODIUM: 137 mmol/L (ref 135–145)
TOTAL PROTEIN: 8.1 g/dL (ref 6.5–8.1)

## 2017-07-04 LAB — I-STAT TROPONIN, ED: Troponin i, poc: 0 ng/mL (ref 0.00–0.08)

## 2017-07-04 MED ORDER — NITROGLYCERIN 0.4 % RE OINT: 1.0000 "application " | TOPICAL_OINTMENT | Freq: Two times a day (BID) | RECTAL | 0 refills | Status: DC

## 2017-07-04 MED ORDER — SODIUM CHLORIDE 0.9 % IV BOLUS (SEPSIS)
1000.0000 mL | Freq: Once | INTRAVENOUS | Status: AC
  Administered 2017-07-04: 1000 mL via INTRAVENOUS

## 2017-07-04 MED ORDER — LIDOCAINE HCL 2 % EX GEL
1.0000 "application " | Freq: Once | CUTANEOUS | Status: AC
  Administered 2017-07-04: 1 via TOPICAL
  Filled 2017-07-04: qty 20

## 2017-07-04 MED ORDER — MORPHINE SULFATE (PF) 4 MG/ML IV SOLN
4.0000 mg | Freq: Once | INTRAVENOUS | Status: AC
  Administered 2017-07-04: 4 mg via INTRAVENOUS
  Filled 2017-07-04: qty 1

## 2017-07-04 MED ORDER — BISACODYL 5 MG PO TBEC: 5.0000 mg | DELAYED_RELEASE_TABLET | Freq: Two times a day (BID) | ORAL | 0 refills | Status: DC

## 2017-07-04 MED ORDER — IOPAMIDOL (ISOVUE-300) INJECTION 61%
INTRAVENOUS | Status: AC
  Administered 2017-07-04: 100 mL via INTRAVENOUS
  Filled 2017-07-04: qty 100

## 2017-07-04 MED ORDER — LIDOCAINE-HYDROCORTISONE ACE 3-0.5 % RE CREA: 1.0000 | TOPICAL_CREAM | Freq: Two times a day (BID) | RECTAL | 0 refills | Status: DC

## 2017-07-04 MED ORDER — SULFAMETHOXAZOLE-TRIMETHOPRIM 800-160 MG PO TABS: 1.0000 | ORAL_TABLET | Freq: Two times a day (BID) | ORAL | 0 refills | Status: AC

## 2017-07-04 NOTE — ED Provider Notes (Addendum)
[facility] EMERGENCY DEPARTMENT Provider Note   CSN: [id number] Arrival date & time: 07/04/17  1413     History   Chief Complaint Chief Complaint  Patient presents with  . Abdominal Pain/weakness    HPI Cesar Morales is a 57 y.o. male.  HPI  Tuesday had sinus symptoms that resolved Thursday felt constipated, took milk of magnesia, had normal bowel movement, but then after that felt that rectum swelled and was leaking stool. Yesterday took an enema, had another bowel movement, then did suppository yesterday and today.  Feels like rectum swollen.  Having groin pain as well. Severe pain in rectum and lower abdomen. Feeling of rectal spasm. Leaking stool. Has been constipated before.  Feels like needs to go to the bathroom then has cramping pain.  If stands up feels lightheaded.  Doesn't feel like he has had fever since Tuesday.  Has been having chills. Was nauseas but that resolved.  No vomiting.  Has not urinated since yesterday evening.  Feels pressure but can't urinate.  No dysuria. Has hx of rx for flomax in the past. No hx of rectal foreign body.  Past Medical History:  Diagnosis Date  . COPD (chronic obstructive pulmonary disease) (HCC)   . Coronary artery disease   . Diabetes mellitus without complication (HCC)   . Dyslipidemia   . HTN (hypertension)     Patient Active Problem List   Diagnosis Date Noted  . Family history of coronary artery disease 04/02/2017  . Abnormal chest CT 04/02/2017  . Smoker 04/02/2017  . Pre-operative cardiovascular examination 04/02/2017  . Unstable angina (HCC) 02/24/2017  . Essential hypertension 02/24/2017  . Chronic back pain 04/22/2016  . Dyslipidemia 01/03/2016  . GERD (gastroesophageal reflux disease) 01/03/2016  . Diabetic peripheral neuropathy (HCC) 01/03/2016  . Insomnia 01/03/2016  . Obesity (BMI 30-39.9) 01/03/2016  . Constipation 08/09/2015  . Non-insulin treated type 2 diabetes mellitus (HCC)   . Coronary  artery disease   . Chronic obstructive pulmonary disease (HCC)     Past Surgical History:  Procedure Laterality Date  . CHOLECYSTECTOMY    . HERNIA REPAIR    . LEFT HEART CATH AND CORONARY ANGIOGRAPHY N/A 02/24/2017   Procedure: LEFT HEART CATH AND CORONARY ANGIOGRAPHY;  Surgeon: Marykay Lex, MD;  Location: [facility] INVASIVE CV LAB;  Service: Cardiovascular;  Laterality: N/A;       Home Medications    Prior to Admission medications   Medication Sig Start Date End Date Taking? Authorizing Provider  albuterol (PROVENTIL HFA;VENTOLIN HFA) 108 (90 Base) MCG/ACT inhaler Inhale 2 puffs into the lungs every 6 (six) hours as needed for wheezing or shortness of breath.    [provider]  aspirin EC 81 MG tablet Take 81 mg by mouth daily.    [provider]  bisacodyl (DULCOLAX) 5 MG EC tablet Take 1 tablet (5 mg total) by mouth 2 (two) times daily. 07/04/17   Alvira Monday, MD  chlorthalidone (HYGROTON) 25 MG tablet TAKE 1 TABLET DAILY 04/06/17   Jannifer Rodney A, FNP  dexlansoprazole (DEXILANT) 60 MG capsule Take 1 capsule (60 mg total) by mouth daily. 08/25/16   Jannifer Rodney A, FNP  gabapentin (NEURONTIN) 600 MG tablet TAKE (1) TABLET THREE TIMES DAILY. 06/23/17   Junie Spencer, FNP  glipiZIDE (GLUCOTROL) 5 MG tablet Take 1 tablet (5 mg total) by mouth daily before breakfast. 01/03/16   Jannifer Rodney A, FNP  isosorbide mononitrate (IMDUR) 120 MG 24 hr tablet TAKE 1  TABLET DAILY 06/02/17   Rollene Rotunda, MD  lidocaine-hydrocortisone (ANAMANTEL HC) 3-0.5 % CREA Place 1 Applicatorful rectally 2 (two) times daily. 07/04/17   Alvira Monday, MD  metFORMIN (GLUCOPHAGE) 500 MG tablet TAKE (1) TABLET TWICE A DAY WITH MEALS (BREAKFAST AND SUPPER) 06/25/17   Jannifer Rodney A, FNP  nitroGLYCERIN (NITROSTAT) 0.4 MG SL tablet Place 1 tablet (0.4 mg total) under the tongue every 5 (five) minutes as needed for chest pain. 02/24/17   Marcelino Duster, PA  Nitroglycerin 0.4 % OINT Place 1  application rectally every 12 (twelve) hours for 21 days. 07/04/17 07/25/17  Alvira Monday, MD  rosuvastatin (CRESTOR) 20 MG tablet Take 1 tablet (20 mg total) daily by mouth. 04/02/17 07/01/17  Kilroy, Eda Paschal, PA-C  sulfamethoxazole-trimethoprim (BACTRIM DS,SEPTRA DS) 800-160 MG tablet Take 1 tablet by mouth 2 (two) times daily for 10 days. 07/04/17 07/14/17  Alvira Monday, MD  traZODone (DESYREL) 100 MG tablet TAKE ONE TABLET AT BEDTIME 04/06/17   Junie Spencer, FNP    Family History Family History  Problem Relation Age of Onset  . Heart disease Mother 68       Died age 29  . Heart disease Father        Atrial fib  . Cancer Father     Social History Social History   Tobacco Use  . Smoking status: Current Every Day Smoker    Packs/day: 0.50  . Smokeless tobacco: Never Used  Substance Use Topics  . Alcohol use: No  . Drug use: No     Allergies   Lisinopril and Lipitor [atorvastatin]   Review of Systems Review of Systems  Constitutional: Positive for chills. Negative for fever.  Respiratory: Negative for cough.   Cardiovascular: Negative for chest pain.  Gastrointestinal: Positive for abdominal pain, constipation and nausea. Negative for diarrhea and vomiting.  Genitourinary: Positive for difficulty urinating. Negative for dysuria.  Skin: Negative for rash.  Neurological: Positive for light-headedness.     Physical Exam Updated Vital Signs BP 124/89   Pulse 72   Temp 97.9 F (36.6 C) (Oral)   Resp 12   SpO2 95%   Physical Exam  Constitutional: He is oriented to person, place, and time. He appears well-developed and well-nourished. No distress.  HENT:  Head: Normocephalic and atraumatic.  Eyes: Conjunctivae and EOM are normal.  Neck: Normal range of motion.  Cardiovascular: Normal rate, regular rhythm, normal heart sounds and intact distal pulses. Exam reveals no gallop and no friction rub.  No murmur heard. Pulmonary/Chest: Effort normal and breath  sounds normal. No respiratory distress. He has no wheezes. He has no rales.  Abdominal: Soft. He exhibits no distension. There is tenderness (severe suprapubic, LLQ). There is no guarding.  Genitourinary: Rectal exam shows external hemorrhoid. Prostate is tender.  Genitourinary Comments: Initially significant stool on rectal exam limiting prostate exam and pt reporting severe tenderness   Musculoskeletal: He exhibits no edema.  Neurological: He is alert and oriented to person, place, and time.  Skin: Skin is warm and dry. He is not diaphoretic.  Nursing note and vitals reviewed.    ED Treatments / Results  Labs (all labs ordered are listed, but only abnormal results are displayed) Labs Reviewed  COMPREHENSIVE METABOLIC PANEL - Abnormal; Notable for the following components:      Result Value   CO2 18 (*)    Glucose, Bld 132 (*)    Total Bilirubin 1.3 (*)    All other components within normal  limits  CBC - Abnormal; Notable for the following components:   WBC 13.0 (*)    Hemoglobin 17.7 (*)    All other components within normal limits  URINALYSIS, ROUTINE W REFLEX MICROSCOPIC - Abnormal; Notable for the following components:   Specific Gravity, Urine >1.046 (*)    Hgb urine dipstick LARGE (*)    Ketones, ur 5 (*)    All other components within normal limits  URINE CULTURE  LIPASE, BLOOD  I-STAT TROPONIN, ED    EKG  EKG Interpretation  Date/Time:  Saturday July 04 2017 14:19:09 EST Ventricular Rate:  81 PR Interval:  166 QRS Duration: 90 QT Interval:  368 QTC Calculation: 427 R Axis:   -10 Text Interpretation:  Normal sinus rhythm Normal ECG No significant change since last tracing Confirmed by Alvira Monday ([id number]) on 07/04/2017 5:25:15 PM       Radiology Ct Abdomen Pelvis W Contrast  Result Date: 07/04/2017 CLINICAL DATA:  Pain in right groin and rectum. EXAM: CT ABDOMEN AND PELVIS WITH CONTRAST TECHNIQUE: Multidetector CT imaging of the abdomen and pelvis  was performed using the standard protocol following bolus administration of intravenous contrast. CONTRAST:  ISOVUE-300 IOPAMIDOL (ISOVUE-300) INJECTION 61% COMPARISON:  August 09, 2015 FINDINGS: Lower chest: Right-sided gynecomastia, partially visualized. Lung bases are normal as is the remainder of the lower chest. Hepatobiliary: No focal liver abnormality is seen. Status post cholecystectomy. No biliary dilatation. Pancreas: Unremarkable. No pancreatic ductal dilatation or surrounding inflammatory changes. Spleen: Normal in size without focal abnormality. Adrenals/Urinary Tract: The bladder is decompressed with a Foley catheter. The kidneys and ureters are normal. The adrenal glands are normal. Stomach/Bowel: The stomach is poorly distended but grossly unremarkable. The stomach is normal. There is a moderate stool ball in the rectum without rectal wall thickening or pneumatosis. The colon and appendix are normal. Vascular/Lymphatic: No significant vascular findings are present. No enlarged abdominal or pelvic lymph nodes. Reproductive: Prostate is unremarkable. Other: No free air or free fluid.  No other abnormalities. Musculoskeletal: No acute or significant osseous findings. IMPRESSION: 1. Moderate stool ball in the rectum without wall thickening or pneumatosis. 2. No other cause for rectal or right groin pain identified. Electronically Signed   By: Gerome Sam III M.D   On: 07/04/2017 18:07    Procedures Fecal disimpaction Date/Time: 07/05/2017 1:16 PM Performed by: Alvira Monday, MD Authorized by: Alvira Monday, MD  Consent: Verbal consent obtained. Risks and benefits: risks, benefits and alternatives were discussed Consent given by: patient Patient identity confirmed: verbally with patient Time out: Immediately prior to procedure a "time out" was called to verify the correct patient, procedure, equipment, support staff and site/side marked as required. Preparation: Patient was  prepped and draped in the usual sterile fashion. Local anesthesia used: yes Anesthesia: local infiltration  Anesthesia: Local anesthesia used: yes Local Anesthetic: lidocaine 1% without epinephrine Patient tolerance: Patient tolerated the procedure well with no immediate complications    (including critical care time)  Medications Ordered in ED Medications  sodium chloride 0.9 % bolus 1,000 mL (0 mLs Intravenous Stopped 07/04/17 1846)  morphine 4 MG/ML injection 4 mg (4 mg Intravenous Given 07/04/17 1644)  iopamidol (ISOVUE-300) 61 % injection (100 mLs Intravenous Contrast Given 07/04/17 1732)  lidocaine (XYLOCAINE) 2 % jelly 1 application (1 application Topical Given 07/04/17 1846)     Initial Impression / Assessment and Plan / ED Course  I have reviewed the triage vital signs and the nursing notes.  Pertinent labs & imaging  results that were available during my care of the patient were reviewed by me and considered in my medical decision making (see chart for details).      57yo male with history above presents with concern for lower abdominal pain, rectal pain, constipation and inability to urinate.  CT abdomen pelvis shows no sign of diverticulitis or other abnormalities.  Exam consistent with hemorrhoids, large amount of stool on exam, however severe pain with digital exam and pt with rectal spasms which may be consistent with fissure.  For rectal spasm, will give topical nitro, and gave rx for topical hydrocortisone/lidocaine.  After CT, initially suspected constipation leading to hemorrhoids/fissure and constipation related urinary retention.  Performed rectal disimpaction. On reevaluation able to better palpate prostate and patient reports severe prostatic tenderness as well. Given boring rectal pain, prostate tenderness, urinary retention, will initiate prostatitis treatment with bactrim.   Foley cathter placed for urinary retention, 900cc on bladder scan.  Recommend Urology  follow up for voiding trial, reeval for possible prostatitis. Patient discharged in stable condition with understanding of reasons to return.  Discussed constipation management in detail.    Final Clinical Impressions(s) / ED Diagnoses   Final diagnoses:  Constipation, unspecified constipation type  Acute prostatitis  Urinary retention  Hemorrhoids, unspecified hemorrhoid type    ED Discharge Orders        Ordered    sulfamethoxazole-trimethoprim (BACTRIM DS,SEPTRA DS) 800-160 MG tablet  2 times daily     07/04/17 1921    bisacodyl (DULCOLAX) 5 MG EC tablet  2 times daily     07/04/17 1921    Nitroglycerin 0.4 % OINT  Every 12 hours     07/04/17 1929    lidocaine-hydrocortisone (ANAMANTEL HC) 3-0.5 % CREA  2 times daily     07/04/17 1929       Alvira Monday, MD 07/05/17 1315    Alvira Monday, MD 07/05/17 1317

## 2017-07-04 NOTE — Discharge Instructions (Signed)
Stay hydrated!!  I recommended a miralax taper (4 caps in 1 32oz bottle day 1) and dulcolax.

## 2017-07-04 NOTE — ED Notes (Signed)
ED Provider at bedside. 

## 2017-07-04 NOTE — ED Triage Notes (Signed)
Patient complains of a few days of weakness and constipation with lower abdominal cramping. Has taken mag citrate with some relief but now discomfort worse and making him feel near syncopal. Alert and oriented

## 2017-07-04 NOTE — ED Notes (Signed)
Bladder scanned pt, bladder volume >900. Notified Tray(RN)

## 2017-07-05 LAB — URINE CULTURE: CULTURE: NO GROWTH

## 2017-08-04 ENCOUNTER — Ambulatory Visit: Payer: 59 | Admitting: Family

## 2017-08-20 ENCOUNTER — Other Ambulatory Visit: Payer: Self-pay | Admitting: Family

## 2017-08-20 DIAGNOSIS — E1142 Type 2 diabetes mellitus with diabetic polyneuropathy: Secondary | ICD-10-CM

## 2017-09-28 ENCOUNTER — Other Ambulatory Visit: Payer: Self-pay | Admitting: Family

## 2017-09-28 DIAGNOSIS — E1142 Type 2 diabetes mellitus with diabetic polyneuropathy: Secondary | ICD-10-CM

## 2017-09-28 DIAGNOSIS — E119 Type 2 diabetes mellitus without complications: Secondary | ICD-10-CM

## 2017-10-26 ENCOUNTER — Telehealth: Payer: Self-pay | Admitting: Cardiology

## 2017-10-26 NOTE — Telephone Encounter (Signed)
New Message    Pt states that he has to have a DOT physical at his primary doctor [facility] practice, but it requires a stress test unless his Cardiologist sends a letter stating he doesn't need a stress to. So pt wants a letter stating the he doesn't need a stress test for his DOT sent to his pcp. Please call

## 2017-10-26 NOTE — Telephone Encounter (Signed)
Patient called in stating that he needs cardiac clearance for his DOT visit with his PCP. The PCP needs a letter stating that he does not need a stress test at this time.  The patient had a cardiac cath on 02/2017 and was last seen in the office by Corine Shelter, PA on 04/02/2017. He was previously seen by Dr. Antoine Poche in 2017.  He has been advised that he may need to be seen before this may be done. Message routed to the providers for their input.

## 2017-10-27 ENCOUNTER — Encounter: Payer: Self-pay | Admitting: Nurse Practitioner

## 2017-10-28 ENCOUNTER — Encounter: Payer: Self-pay | Admitting: Family Medicine

## 2017-10-28 NOTE — Telephone Encounter (Signed)
OK to write a letter of DOT clearance.

## 2017-10-29 ENCOUNTER — Other Ambulatory Visit: Payer: Self-pay | Admitting: Family

## 2017-11-02 NOTE — Telephone Encounter (Signed)
Pt aware of DOT letter will be at front desk for pt, pt stated he will pick in up on Thursday.

## 2017-11-13 ENCOUNTER — Encounter: Payer: Self-pay | Admitting: Family Medicine

## 2017-11-13 ENCOUNTER — Ambulatory Visit: Payer: Self-pay | Admitting: Family Medicine

## 2017-11-13 VITALS — BP 125/73 | HR 81 | Temp 97.8°F | Ht 72.0 in | Wt 269.0 lb

## 2017-11-13 DIAGNOSIS — E119 Type 2 diabetes mellitus without complications: Secondary | ICD-10-CM

## 2017-11-13 DIAGNOSIS — Z024 Encounter for examination for driving license: Secondary | ICD-10-CM

## 2017-11-13 LAB — URINALYSIS
Appearance Ur: NEGATIVE
BILIRUBIN UA: NEGATIVE
Color, UA: NEGATIVE
GLUCOSE, UA: NEGATIVE
Ketones, UA: NEGATIVE
Leukocytes, UA: NEGATIVE
Nitrite, UA: NEGATIVE
PROTEIN UA: NEGATIVE
RBC UA: NEGATIVE
SPEC GRAV UA: 1.025 (ref 1.005–1.030)
Urobilinogen, Ur: 0.2 mg/dL (ref 0.2–1.0)
pH, UA: 5.5 (ref 5.0–7.5)

## 2017-11-13 LAB — BAYER DCA HB A1C WAIVED: HB A1C: 6.1 % (ref <7.0)

## 2017-11-13 NOTE — Progress Notes (Signed)
Subjective:  Patient ID: Cesar Morales, male    DOB: April 22, 1961  Age: 57 y.o. MRN: [id number]  CC: Private DOT Physical   HPI Cesar Morales presents for DOT clearance. Cleared by [facility]. Had cath 10/18 showing medically controllable CAD.  Depression screen [facility] 2/9 02/23/2017 08/25/2016 07/15/2016  Decreased Interest 0 0 0  Down, Depressed, Hopeless 1 0 0  PHQ - 2 Score 1 0 0    History Cesar Morales has a past medical history of COPD (chronic obstructive pulmonary disease) (HCC), Coronary artery disease, Diabetes mellitus without complication (HCC), Dyslipidemia, and HTN (hypertension).   He has a past surgical history that includes Cholecystectomy; Hernia repair; and LEFT HEART CATH AND CORONARY ANGIOGRAPHY (N/A, 02/24/2017).   His family history includes Cancer in his father; Heart disease in his father; Heart disease (age of onset: 16) in his mother.He reports that he has been smoking.  He has been smoking about 0.50 packs per day. He has never used smokeless tobacco. He reports that he does not drink alcohol or use drugs.    ROS Review of Systems  Constitutional: Negative.   HENT: Negative.   Eyes: Negative for visual disturbance.  Respiratory: Negative for cough and shortness of breath.   Cardiovascular: Negative for chest pain and leg swelling.  Gastrointestinal: Negative for abdominal pain, diarrhea, nausea and vomiting.  Genitourinary: Negative for difficulty urinating.  Musculoskeletal: Negative for arthralgias and myalgias.  Skin: Negative for rash.  Neurological: Negative for headaches.  Psychiatric/Behavioral: Negative for sleep disturbance.    Objective:  BP 125/73   Pulse 81   Temp 97.8 F (36.6 C) (Oral)   Ht 6' (1.829 m)   Wt 269 lb (122 kg)   BMI 36.48 kg/m   BP Readings from Last 3 Encounters:  11/13/17 125/73  07/04/17 124/89  04/02/17 110/70    Wt Readings from Last 3 Encounters:  11/13/17 269 lb (122 kg)  04/02/17 276 lb 12.8 oz (125.6 kg)  02/24/17  272 lb 14.4 oz (123.8 kg)     Physical Exam  Constitutional: He is oriented to person, place, and time. He appears well-developed and well-nourished.  HENT:  Head: Normocephalic and atraumatic.  Mouth/Throat: Oropharynx is clear and moist.  Eyes: Pupils are equal, round, and reactive to light. EOM are normal.  Neck: Normal range of motion. No tracheal deviation present. No thyromegaly present.  Cardiovascular: Normal rate, regular rhythm and normal heart sounds. Exam reveals no gallop and no friction rub.  No murmur heard. Pulmonary/Chest: Breath sounds normal. He has no wheezes. He has no rales.  Abdominal: Soft. Bowel sounds are normal. He exhibits no distension and no mass. There is no tenderness.  Musculoskeletal: Normal range of motion. He exhibits no edema.  Lymphadenopathy:    He has no cervical adenopathy.  Neurological: He is alert and oriented to person, place, and time.  Skin: Skin is warm and dry.  Psychiatric: He has a normal mood and affect.   Results for orders placed or performed in visit on 11/13/17  Urinalysis  Result Value Ref Range   Specific Gravity, UA 1.025 1.005 - 1.030   pH, UA 5.5 5.0 - 7.5   Color, UA Negative Yellow   Appearance Ur Negative Clear   Leukocytes, UA Negative Negative   Protein, UA Negative Negative/Trace   Glucose, UA Negative Negative   Ketones, UA Negative Negative   RBC, UA Negative Negative   Bilirubin, UA Negative Negative   Urobilinogen, Ur 0.2 0.2 - 1.0 mg/dL  Nitrite, UA Negative Negative  Bayer DCA Hb A1c Waived  Result Value Ref Range   HB A1C (BAYER DCA - WAIVED) 6.1 <7.0 %      Assessment & Plan:   Cesar Morales was seen today for private dot physical.  Diagnoses and all orders for this visit:  Encounter for Department of Transportation (DOT) examination for driving license renewal  Non-insulin treated type 2 diabetes mellitus (HCC) -     Urinalysis -     Bayer DCA Hb A1c Waived       I have discontinued Aurther Loft  Thies's glipiZIDE, rosuvastatin, and traZODone. I am also having him maintain his aspirin EC, albuterol, dexlansoprazole, nitroGLYCERIN, isosorbide mononitrate, bisacodyl, lidocaine-hydrocortisone, gabapentin, metFORMIN, and chlorthalidone.  Allergies as of 11/13/2017      Reactions   Lisinopril Swelling   angioedema   Lipitor [atorvastatin]    Stomach cramps      Medication List        Accurate as of 11/13/17  4:16 PM. Always use your most recent med list.          albuterol 108 (90 Base) MCG/ACT inhaler Commonly known as:  PROVENTIL HFA;VENTOLIN HFA Inhale 2 puffs into the lungs every 6 (six) hours as needed for wheezing or shortness of breath.   aspirin EC 81 MG tablet Take 81 mg by mouth daily.   bisacodyl 5 MG EC tablet Commonly known as:  DULCOLAX Take 1 tablet (5 mg total) by mouth 2 (two) times daily.   chlorthalidone 25 MG tablet Commonly known as:  HYGROTON TAKE 1 TABLET DAILY   dexlansoprazole 60 MG capsule Commonly known as:  DEXILANT Take 1 capsule (60 mg total) by mouth daily.   gabapentin 600 MG tablet Commonly known as:  NEURONTIN TAKE (1) TABLET THREE TIMES DAILY.   isosorbide mononitrate 120 MG 24 hr tablet Commonly known as:  IMDUR TAKE 1 TABLET DAILY   lidocaine-hydrocortisone 3-0.5 % Crea Commonly known as:  ANAMANTEL HC Place 1 Applicatorful rectally 2 (two) times daily.   metFORMIN 500 MG tablet Commonly known as:  GLUCOPHAGE TAKE (1) TABLET TWICE A DAY WITH MEALS (BREAKFAST AND SUPPER)   nitroGLYCERIN 0.4 MG SL tablet Commonly known as:  NITROSTAT Place 1 tablet (0.4 mg total) under the tongue every 5 (five) minutes as needed for chest pain.        Follow-up: Return in about 1 year (around 11/14/2018).  Mechele Claude, M.D.

## 2017-11-18 ENCOUNTER — Other Ambulatory Visit: Payer: Self-pay | Admitting: Family

## 2017-11-18 DIAGNOSIS — E1142 Type 2 diabetes mellitus with diabetic polyneuropathy: Secondary | ICD-10-CM

## 2017-11-30 ENCOUNTER — Other Ambulatory Visit: Payer: Self-pay | Admitting: Family Medicine

## 2017-12-22 ENCOUNTER — Ambulatory Visit (INDEPENDENT_AMBULATORY_CARE_PROVIDER_SITE_OTHER): Payer: Self-pay | Admitting: Family Medicine

## 2017-12-22 ENCOUNTER — Encounter: Payer: Self-pay | Admitting: Family Medicine

## 2017-12-22 VITALS — BP 146/90 | HR 81 | Temp 97.8°F | Ht 72.0 in | Wt 269.0 lb

## 2017-12-22 DIAGNOSIS — Z8261 Family history of arthritis: Secondary | ICD-10-CM

## 2017-12-22 DIAGNOSIS — M2550 Pain in unspecified joint: Secondary | ICD-10-CM

## 2017-12-22 MED ORDER — PREDNISONE 10 MG (21) PO TBPK: ORAL_TABLET | ORAL | 0 refills | Status: DC

## 2017-12-22 NOTE — Progress Notes (Signed)
Subjective: CC: joint pain PCP: Junie Spencer, FNP NGE:XBMWU Cesar Morales is a 57 y.o. male presenting to clinic today for:  1. Joint pain Patient reports a greater than 2-week history of bilateral hand pain.  He reports associated burning and some tightness in the hands.  He describes it as an ache with a tingling/burning sensation with gripping.  No preceding injury.  He notes he has had multiple orthopedic issues, including recent shoulder replacement on the right.  He is now having issues with his left shoulder.  No gross joint deformity.  No preceding injury to the hands.  He has been taking Neurontin as prescribed by his PCP for his chronic neuropathy that is thought to be secondary to diabetes.  Most recent A1c was under good control at less than 7.  Patient reports he has been using Tylenol arthritis with little improvement in symptoms.  He is unable to use oral NSAIDs secondary to cardiac medications and recommendations by his cardiologist.  Family history is significant for rheumatoid arthritis in his mother, type 1 diabetes in his knees and thyroid disorder in his sister.  No other known autoimmune disorders.   ROS: Per HPI  Allergies  Allergen Reactions  . Lisinopril Swelling    angioedema  . Lipitor [Atorvastatin]     Stomach cramps   Past Medical History:  Diagnosis Date  . COPD (chronic obstructive pulmonary disease) (HCC)   . Coronary artery disease   . Diabetes mellitus without complication (HCC)   . Dyslipidemia   . HTN (hypertension)     Current Outpatient Medications:  .  albuterol (PROVENTIL HFA;VENTOLIN HFA) 108 (90 Base) MCG/ACT inhaler, Inhale 2 puffs into the lungs every 6 (six) hours as needed for wheezing or shortness of breath., Disp: , Rfl:  .  aspirin EC 81 MG tablet, Take 81 mg by mouth daily., Disp: , Rfl:  .  bisacodyl (DULCOLAX) 5 MG EC tablet, Take 1 tablet (5 mg total) by mouth 2 (two) times daily., Disp: 14 tablet, Rfl: 0 .  chlorthalidone (HYGROTON)  25 MG tablet, TAKE 1 TABLET DAILY, Disp: 30 tablet, Rfl: 5 .  dexlansoprazole (DEXILANT) 60 MG capsule, Take 1 capsule (60 mg total) by mouth daily., Disp: 90 capsule, Rfl: 1 .  gabapentin (NEURONTIN) 600 MG tablet, TAKE (1) TABLET THREE TIMES DAILY., Disp: 90 tablet, Rfl: 0 .  isosorbide mononitrate (IMDUR) 120 MG 24 hr tablet, TAKE 1 TABLET DAILY, Disp: 30 tablet, Rfl: 0 .  lidocaine-hydrocortisone (ANAMANTEL HC) 3-0.5 % CREA, Place 1 Applicatorful rectally 2 (two) times daily., Disp: 28.3 g, Rfl: 0 .  metFORMIN (GLUCOPHAGE) 500 MG tablet, TAKE (1) TABLET TWICE A DAY WITH MEALS (BREAKFAST AND SUPPER), Disp: 180 tablet, Rfl: 0 .  nitroGLYCERIN (NITROSTAT) 0.4 MG SL tablet, Place 1 tablet (0.4 mg total) under the tongue every 5 (five) minutes as needed for chest pain., Disp: 25 tablet, Rfl: 1 Social History   Socioeconomic History  . Marital status: Legally Separated    Spouse name: Not on file  . Number of children: Not on file  . Years of education: Not on file  . Highest education level: Not on file  Occupational History  . Not on file  Social Needs  . Financial resource strain: Not on file  . Food insecurity:    Worry: Not on file    Inability: Not on file  . Transportation needs:    Medical: Not on file    Non-medical: Not on file  Tobacco Use  .  Smoking status: Current Every Day Smoker    Packs/day: 0.50  . Smokeless tobacco: Never Used  Substance and Sexual Activity  . Alcohol use: No  . Drug use: No  . Sexual activity: Not on file  Lifestyle  . Physical activity:    Days per week: Not on file    Minutes per session: Not on file  . Stress: Not on file  Relationships  . Social connections:    Talks on phone: Not on file    Gets together: Not on file    Attends religious service: Not on file    Active member of club or organization: Not on file    Attends meetings of clubs or organizations: Not on file    Relationship status: Not on file  . Intimate partner  violence:    Fear of current or ex partner: Not on file    Emotionally abused: Not on file    Physically abused: Not on file    Forced sexual activity: Not on file  Other Topics Concern  . Not on file  Social History Narrative  . Not on file   Family History  Problem Relation Age of Onset  . Heart disease Mother 29       Died age 24  . Heart disease Father        Atrial fib  . Cancer Father     Objective: Office vital signs reviewed. BP (!) 146/90   Pulse 81   Temp 97.8 F (36.6 C) (Oral)   Ht 6' (1.829 m)   Wt 269 lb (122 kg)   BMI 36.48 kg/m   Physical Examination:  General: Awake, alert, obese, No acute distress Extremities: warm, well perfused, No edema, cyanosis or clubbing; +2 pulses bilaterally MSK:   Left upper extremity: Patient has full active range of motion of hand and wrist.  There is no gross bony deformities or inflammation of joints.  He has no tenderness to the joints.  No associated erythema.  Positive Phalen's and positive Tinel's noted.  Right upper extremity: Patient has full active range of motion of hand and wrist.  There is no gross bony deformities or inflammation of joints.  He has no tenderness to the joints.  No associated erythema.  Positive Phalen's and positive Tinel's noted. Skin: dry; intact; no rashes or lesions Neuro: light touch sensation in tact.  Depression screen [facility] 2/9 12/22/2017 02/23/2017 08/25/2016  Decreased Interest 3 0 0  Down, Depressed, Hopeless 3 1 0  PHQ - 2 Score 6 1 0  Altered sleeping 3 - -  Tired, decreased energy 2 - -  Change in appetite 2 - -  Feeling bad or failure about yourself  3 - -  Trouble concentrating 2 - -  Moving slowly or fidgety/restless 2 - -  Suicidal thoughts 0 - -  PHQ-9 Score 20 - -  Difficult doing work/chores Somewhat difficult - -     Assessment/ Plan: 57 y.o. male   1. Polyarthralgia While he demonstrates no deformities of the joints, I am concerned that his symptoms may also be  related to an undiagnosed autoimmune arthritis.  Family history is significant for rheumatoid arthritis and his deceased mother.  He does have a history of diabetes but this has been well controlled as of recent.  He does have positive Phalen's and positive Tinel's, carpal tunnel most certainly could be contributing.  Because of need to avoid NSAIDs, I have placed him on prednisone Dosepak.  Home  care instructions were reviewed with the patient.  Will obtain rheumatoid factor only to evaluate for possible rheumatoid arthritis.  Once he has insurance, would consider obtaining other autoimmune work-up if rheumatoid factor is negative.  We discussed that if symptoms persist or worsen, could consider referral to orthopedics versus rheumatologist.  His PHQ 9 score was elevated, he decided that the reason for his depressive symptoms is related to his chronic pain.  No SI/HI.  He will follow-up with PCP for this. - Rheumatoid factor  2. Family history of rheumatoid arthritis - Rheumatoid factor   Orders Placed This Encounter  Procedures  . Rheumatoid factor   Meds ordered this encounter  Medications  . predniSONE (STERAPRED UNI-PAK 21 TAB) 10 MG (21) TBPK tablet    Sig: As directed x 6 days    Dispense:  21 tablet    Refill:  0      Hulen Skains, DO [facility] [phone]

## 2017-12-22 NOTE — Patient Instructions (Signed)
Carpal Tunnel Syndrome Carpal tunnel syndrome is a condition that causes pain in your hand and arm. The carpal tunnel is a narrow area that is on the palm side of your wrist. Repeated wrist motion or certain diseases may cause swelling in the tunnel. This swelling can pinch the main nerve in the wrist (median nerve). Follow these instructions at home: If you have a splint:  Wear it as told by your doctor. Remove it only as told by your doctor.  Loosen the splint if your fingers: ? Become numb and tingle. ? Turn blue and cold.  Keep the splint clean and dry. General instructions  Take over-the-counter and prescription medicines only as told by your doctor.  Rest your wrist from any activity that may be causing your pain. If needed, talk to your employer about changes that can be made in your work, such as getting a wrist pad to use while typing.  If directed, apply ice to the painful area: ? Put ice in a plastic bag. ? Place a towel between your skin and the bag. ? Leave the ice on for 20 minutes, 2-3 times per day.  Keep all follow-up visits as told by your doctor. This is important.  Do any exercises as told by your doctor, physical therapist, or occupational therapist. Contact a doctor if:  You have new symptoms.  Medicine does not help your pain.  Your symptoms get worse. This information is not intended to replace advice given to you by your health care provider. Make sure you discuss any questions you have with your health care provider. Document Released: 04/24/2011 Document Revised: 10/11/2015 Document Reviewed: 09/20/2014 Elsevier Interactive Patient Education  2018 [employer] Arthritis Arthritis means joint pain. It can also mean joint disease. A joint is a place where bones come together. People who have arthritis may have:  Red joints.  Swollen joints.  Stiff joints.  Warm joints.  A fever.  A feeling of being sick.  Follow these instructions at  home: Pay attention to any changes in your symptoms. Take these actions to help with your pain and swelling. Medicines  Take over-the-counter and prescription medicines only as told by your doctor.  Do not take aspirin for pain if your doctor says that you may have gout. Activity  Rest your joint if your doctor tells you to.  Avoid activities that make the pain worse.  Exercise your joint regularly as told by your doctor. Try doing exercises like: ? Swimming. ? Water aerobics. ? Biking. ? Walking. Joint Care   If your joint is swollen, keep it raised (elevated) if told by your doctor.  If your joint feels stiff in the morning, try taking a warm shower.  If you have diabetes, do not apply heat without asking your doctor.  If told, apply heat to the joint: ? Put a towel between the joint and the hot pack or heating pad. ? Leave the heat on the area for 20-30 minutes.  If told, apply ice to the joint: ? Put ice in a plastic bag. ? Place a towel between your skin and the bag. ? Leave the ice on for 20 minutes, 2-3 times per day.  Keep all follow-up visits as told by your doctor. Contact a doctor if:  The pain gets worse.  You have a fever. Get help right away if:  You have very bad pain in your joint.  You have swelling in your joint.  Your joint is red.  Many  joints become painful and swollen.  You have very bad back pain.  Your leg is very weak.  You cannot control your pee (urine) or poop (stool). This information is not intended to replace advice given to you by your health care provider. Make sure you discuss any questions you have with your health care provider. Document Released: 07/30/2009 Document Revised: 10/11/2015 Document Reviewed: 07/31/2014 Elsevier Interactive Patient Education  [employer].

## 2017-12-23 LAB — RHEUMATOID FACTOR: Rhuematoid fact SerPl-aCnc: 14 IU/mL — ABNORMAL HIGH (ref 0.0–13.9)

## 2018-01-01 ENCOUNTER — Other Ambulatory Visit: Payer: Self-pay | Admitting: Family

## 2018-01-01 DIAGNOSIS — E119 Type 2 diabetes mellitus without complications: Secondary | ICD-10-CM

## 2018-01-01 DIAGNOSIS — E1142 Type 2 diabetes mellitus with diabetic polyneuropathy: Secondary | ICD-10-CM

## 2018-02-05 ENCOUNTER — Other Ambulatory Visit: Payer: Self-pay | Admitting: Family

## 2018-02-05 DIAGNOSIS — E1142 Type 2 diabetes mellitus with diabetic polyneuropathy: Secondary | ICD-10-CM

## 2018-03-08 ENCOUNTER — Ambulatory Visit (INDEPENDENT_AMBULATORY_CARE_PROVIDER_SITE_OTHER): Payer: Self-pay | Admitting: Family

## 2018-03-08 ENCOUNTER — Encounter: Payer: Self-pay | Admitting: Family

## 2018-03-08 VITALS — BP 133/80 | HR 76 | Temp 97.5°F | Ht 72.0 in | Wt 270.2 lb

## 2018-03-08 DIAGNOSIS — Z23 Encounter for immunization: Secondary | ICD-10-CM

## 2018-03-08 DIAGNOSIS — M2550 Pain in unspecified joint: Secondary | ICD-10-CM

## 2018-03-08 DIAGNOSIS — F321 Major depressive disorder, single episode, moderate: Secondary | ICD-10-CM

## 2018-03-08 DIAGNOSIS — E119 Type 2 diabetes mellitus without complications: Secondary | ICD-10-CM

## 2018-03-08 DIAGNOSIS — E1142 Type 2 diabetes mellitus with diabetic polyneuropathy: Secondary | ICD-10-CM

## 2018-03-08 DIAGNOSIS — G4700 Insomnia, unspecified: Secondary | ICD-10-CM

## 2018-03-08 DIAGNOSIS — Z8261 Family history of arthritis: Secondary | ICD-10-CM

## 2018-03-08 DIAGNOSIS — J449 Chronic obstructive pulmonary disease, unspecified: Secondary | ICD-10-CM

## 2018-03-08 DIAGNOSIS — R768 Other specified abnormal immunological findings in serum: Secondary | ICD-10-CM

## 2018-03-08 MED ORDER — PREDNISONE 10 MG (21) PO TBPK: ORAL_TABLET | ORAL | 0 refills | Status: DC

## 2018-03-08 MED ORDER — ALBUTEROL SULFATE HFA 108 (90 BASE) MCG/ACT IN AERS: 2.0000 | INHALATION_SPRAY | Freq: Four times a day (QID) | RESPIRATORY_TRACT | 2 refills | Status: AC | PRN

## 2018-03-08 MED ORDER — NAPROXEN 500 MG PO TABS: 500.0000 mg | ORAL_TABLET | Freq: Two times a day (BID) | ORAL | 1 refills | Status: DC

## 2018-03-08 MED ORDER — CITALOPRAM HYDROBROMIDE 20 MG PO TABS: 20.0000 mg | ORAL_TABLET | Freq: Every day | ORAL | 1 refills | Status: DC

## 2018-03-08 MED ORDER — TRAZODONE HCL 50 MG PO TABS: 50.0000 mg | ORAL_TABLET | Freq: Every evening | ORAL | 3 refills | Status: DC | PRN

## 2018-03-08 NOTE — Patient Instructions (Signed)
Rheumatoid Arthritis Rheumatoid arthritis (RA) is a long-term (chronic) disease that causes inflammation in your joints. RA may start slowly. It usually affects the small joints of the hands and feet. Usually, the same joints are affected on both sides of your body. Inflammation from RA can also affect other parts of your body, including your heart, eyes, or lungs. RA is an autoimmune disease. That means that your body's defense system (immune system) mistakenly attacks healthy body tissues. There is no cure for RA, but medicines can help your symptoms and halt or slow down the progression of the disease. What are the causes? The exact cause of RA is not known. What increases the risk? This condition is more likely to develop in:  Women.  People who have a family history of RA or other autoimmune diseases.  What are the signs or symptoms? Symptoms of this condition vary from person to person. Symptoms usually start gradually. They are often worse in the morning. The first symptom may be morning stiffness that lasts longer than 30 minutes. As RA progresses, symptoms may include:  Pain, stiffness, swelling, warmth, and tenderness in joints on both sides of your body.  Loss of energy.  Loss of appetite.  Weight loss.  Low-grade fever.  Dry eyes and dry mouth.  Firm lumps (rheumatoid nodules) that grow beneath your skin in areas such as your forearm bones near your elbows and on your hands.  Changes in the appearance of joints (deformity) and loss of joint function.  Symptoms of RA often come and go. Sometimes, symptoms get worse for a period of time. These are called flares. How is this diagnosed? This condition is diagnosed based on your symptoms, medical history, and physical exam. You may have X-rays or MRI to check for the type of joint changes that are caused by RA. You may also have blood tests to look for:  Proteins (antibodies) that your immune system may make if you have RA.  They include rheumatoid factor (RF) and anti-CCP. ? When blood tests show these proteins, you are said to have "seropositive RA." ? When blood tests do not show these proteins, you may have "seronegative RA."  Inflammation in your blood.  A low number of red blood cells (anemia).  How is this treated? The goals of treatment are to relieve pain, reduce inflammation, and slow down or stop joint damage and disability. Treatment may include:  Lifestyle changes. It is important to rest, eat a healthy diet, and exercise.  Medicines. Your health care provider may adjust your medicines every 3 months until treatment goals are reached. Common medicines include: ? Pain relievers (analgesics). ? Corticosteroids and NSAIDs to reduce inflammation. ? Disease-modifying antirheumatic drugs (DMARDs) to try to slow the course of the disease. ? Biologic response modifiers to reduce inflammation and damage.  Physical therapy and occupational therapy.  Surgery, if you have severe joint damage. Joint replacement or fusing of joints may be needed.  Your health care provider will work with you to identify the best treatment option for you based on assessment of the overall disease activity in your body. Follow these instructions at home:  Take over-the-counter and prescription medicines only as told by your health care provider.  Start an exercise program as told by your health care provider.  Rest when you are having a flare.  Return to your normal activities as told by your health care provider. Ask your health care provider what activities are safe for you.  Keep all   follow-up visits as told by your health care provider. This is important. Where to find more information:  American College of Rheumatology: www.rheumatology.org  Arthritis Foundation: www.arthritis.org Contact a health care provider if:  You have a flare-up of RA symptoms.  You have a fever.  You have side effects from your  medicines. Get help right away if:  You have chest pain.  You have trouble breathing.  You quickly develop a hot, painful joint that is more severe than your usual joint aches. This information is not intended to replace advice given to you by your health care provider. Make sure you discuss any questions you have with your health care provider. Document Released: 05/02/2000 Document Revised: 10/09/2015 Document Reviewed: 02/15/2015 Elsevier Interactive Patient Education  2018 [employer]vier Inc[employer]   

## 2018-03-08 NOTE — Progress Notes (Signed)
Subjective:    Patient ID: Cesar Morales, male    DOB: [birth year], 57 y.o.   MRN: [id number]  Chief Complaint  Patient presents with  . Shoulder Pain  . Hand Pain  . Foot Pain   Pt presents to the office today for pain in bilateral hands and feet and left shoulder pain. He lost his job in Oct 2018 and is self pay at this time. He states he is still going through workers comp case.   He has constant pain in his hands, fingers, feet, and left shoulder. He had a positive Rheumatoid factor in 12/22/17, but states he can not afford to see a Rheumatologists.   States he wakes up and both hands are swollen. He reports a constant pain of 10 out 10. He has taken tylenol with mild relief.   Shoulder Pain   The pain is present in the left shoulder. This is a recurrent problem. The current episode started more than 1 month ago. There has been a history of trauma. The problem occurs constantly. The problem has been gradually worsening. The quality of the pain is described as aching. The pain is at a severity of 10/10. Associated symptoms include a limited range of motion, numbness, stiffness and tingling. He has tried rest and acetaminophen for the symptoms. The treatment provided no relief.  Diabetes  He presents for his follow-up diabetic visit. He has type 2 diabetes mellitus. His disease course has been worsening. Pertinent negatives for hypoglycemia include no confusion. Associated symptoms include foot paresthesias. Pertinent negatives for diabetes include no blurred vision and no visual change. There are no hypoglycemic complications. Diabetic complications include heart disease. Pertinent negatives for diabetic complications include no CVA or peripheral neuropathy. Risk factors for coronary artery disease include dyslipidemia, male sex, hypertension, sedentary lifestyle and post-menopausal. He is following a generally unhealthy diet. His overall blood glucose range is 130-140 mg/dl. Eye exam is not  current.  Depression         This is a chronic problem.  The current episode started more than 1 year ago.   The onset quality is gradual.   The problem occurs intermittently.  The problem has been waxing and waning since onset.  Associated symptoms include decreased concentration, helplessness, hopelessness, insomnia, irritable, restlessness, decreased interest and sad.     The symptoms are aggravated by family issues.  Past treatments include nothing.  Compliance with treatment is good. Insomnia  Primary symptoms: difficulty falling asleep, frequent awakening, malaise/fatigue.  The current episode started more than one year. The onset quality is gradual. The problem occurs intermittently. The problem has been waxing and waning since onset. PMH includes: depression.  COPD Pt states he has cut back to 3-4 cigarettes a day. States he feels like his SOB is worsening. Requesting refill of his albuterol.  Diabetic Neuropathy Pt states he has a burning pain in bilateral feet 9 out 10. States the gabapentin 600 mg TID helped for a "little while, but not now".   Review of Systems  Constitutional: Positive for malaise/fatigue.  Eyes: Negative for blurred vision.  Musculoskeletal: Positive for stiffness.  Neurological: Positive for tingling and numbness.  Psychiatric/Behavioral: Positive for decreased concentration and depression. Negative for confusion. The patient has insomnia.   All other systems reviewed and are negative.       Objective:   Physical Exam  Constitutional: He is oriented to person, place, and time. He appears well-developed and well-nourished. He is irritable. No distress.  HENT:  Head: Normocephalic.  Right Ear: External ear normal.  Left Ear: External ear normal.  Mouth/Throat: Oropharynx is clear and moist.  Eyes: Pupils are equal, round, and reactive to light. Right eye exhibits no discharge. Left eye exhibits no discharge.  Neck: Normal range of motion. Neck supple. No  thyromegaly present.  Cardiovascular: Normal rate, regular rhythm, normal heart sounds and intact distal pulses.  No murmur heard. Pulmonary/Chest: Effort normal and breath sounds normal. No respiratory distress. He has no wheezes.  Abdominal: Soft. Bowel sounds are normal. He exhibits no distension. There is no tenderness.  Musculoskeletal: He exhibits no edema or tenderness.  Left shoulder pain with abduction, generalized pain hands and swelling   Neurological: He is alert and oriented to person, place, and time. He has normal reflexes. No cranial nerve deficit.  Skin: Skin is warm and dry. No rash noted. No erythema.  Psychiatric: His behavior is normal. Judgment and thought content normal. His mood appears anxious. He exhibits a depressed mood.  PT anxious and tearful today  Vitals reviewed.   BP 133/80   Pulse 76   Temp (!) 97.5 F (36.4 C) (Oral)   Ht 6' (1.829 m)   Wt 270 lb 3.2 oz (122.6 kg)   BMI 36.65 kg/m      Assessment & Plan:  Corwin Kuiken comes in today with chief complaint of Shoulder Pain; Hand Pain; and Foot Pain   Diagnosis and orders addressed:  1. Elevated rheumatoid factor Discussed with the patient the importance of following up with the Rheumatologists. He is self pay, but discussed there are programs that he can get help.  - Ambulatory referral to Rheumatology - CMP14+EGFR - naproxen (NAPROSYN) 500 MG tablet; Take 1 tablet (500 mg total) by mouth 2 (two) times daily with a meal.  Dispense: 60 tablet; Refill: 1 - predniSONE (STERAPRED UNI-PAK 21 TAB) 10 MG (21) TBPK tablet; Use as directed  Dispense: 21 tablet; Refill: 0  2. Generalized joint pain - Ambulatory referral to Rheumatology - CMP14+EGFR - naproxen (NAPROSYN) 500 MG tablet; Take 1 tablet (500 mg total) by mouth 2 (two) times daily with a meal.  Dispense: 60 tablet; Refill: 1 - predniSONE (STERAPRED UNI-PAK 21 TAB) 10 MG (21) TBPK tablet; Use as directed  Dispense: 21 tablet; Refill: 0  3.  Family history of rheumatoid arthritis - Ambulatory referral to Rheumatology - CMP14+EGFR  4. Diabetic peripheral neuropathy ([place])  5. Chronic obstructive pulmonary disease, unspecified COPD type (HCC) - albuterol (PROVENTIL HFA;VENTOLIN HFA) 108 (90 Base) MCG/ACT inhaler; Inhale 2 puffs into the lungs every 6 (six) hours as needed for wheezing or shortness of breath.  Dispense: 1 Inhaler; Refill: 2  6. Non-insulin treated type 2 diabetes mellitus (HCC)  7. Current moderate episode of major depressive disorder, unspecified whether recurrent ([place]) Will start Celexa 20 mg today  Stress management discussed - citalopram (CELEXA) 20 MG tablet; Take 1 tablet (20 mg total) by mouth daily.  Dispense: 90 tablet; Refill: 1  8. Insomnia, unspecified type Sleep ritual   - traZODone (DESYREL) 50 MG tablet; Take 1-2 tablets (50-100 mg total) by mouth at bedtime as needed for sleep.  Dispense: 60 tablet; Refill: 3   Labs pending Health Maintenance reviewed Diet and exercise encouraged  Follow up plan: 6 weeks    Evelina Dun, FNP

## 2018-03-09 LAB — CMP14+EGFR
ALBUMIN: 4.5 g/dL (ref 3.5–5.5)
ALT: 19 IU/L (ref 0–44)
AST: 17 IU/L (ref 0–40)
Albumin/Globulin Ratio: 1.7 (ref 1.2–2.2)
Alkaline Phosphatase: 88 IU/L (ref 39–117)
BUN / CREAT RATIO: 14 (ref 9–20)
BUN: 13 mg/dL (ref 6–24)
Bilirubin Total: 0.2 mg/dL (ref 0.0–1.2)
CALCIUM: 9.2 mg/dL (ref 8.7–10.2)
CO2: 20 mmol/L (ref 20–29)
CREATININE: 0.9 mg/dL (ref 0.76–1.27)
Chloride: 104 mmol/L (ref 96–106)
GFR calc Af Amer: 109 mL/min/{1.73_m2} (ref >59)
GFR, EST NON AFRICAN AMERICAN: 94 mL/min/{1.73_m2} (ref >59)
GLOBULIN, TOTAL: 2.6 g/dL (ref 1.5–4.5)
GLUCOSE: 153 mg/dL — AB (ref 65–99)
Potassium: 3.8 mmol/L (ref 3.5–5.2)
SODIUM: 140 mmol/L (ref 134–144)
Total Protein: 7.1 g/dL (ref 6.0–8.5)

## 2018-04-12 ENCOUNTER — Telehealth: Payer: Self-pay | Admitting: *Deleted

## 2018-04-12 ENCOUNTER — Other Ambulatory Visit: Payer: Self-pay | Admitting: Family

## 2018-04-12 DIAGNOSIS — E1142 Type 2 diabetes mellitus with diabetic polyneuropathy: Secondary | ICD-10-CM

## 2018-04-12 DIAGNOSIS — E119 Type 2 diabetes mellitus without complications: Secondary | ICD-10-CM

## 2018-04-12 MED ORDER — METFORMIN HCL 500 MG PO TABS: ORAL_TABLET | ORAL | 0 refills | Status: DC

## 2018-04-12 NOTE — Telephone Encounter (Signed)
Pt aware refill sent when appt was made

## 2018-04-26 ENCOUNTER — Ambulatory Visit: Payer: Self-pay | Admitting: Family

## 2018-04-26 ENCOUNTER — Encounter: Payer: Self-pay | Admitting: Family

## 2018-04-26 VITALS — BP 136/86 | HR 74 | Temp 97.1°F | Ht 72.0 in | Wt 273.4 lb

## 2018-04-26 DIAGNOSIS — M069 Rheumatoid arthritis, unspecified: Secondary | ICD-10-CM | POA: Insufficient documentation

## 2018-04-26 DIAGNOSIS — G8929 Other chronic pain: Secondary | ICD-10-CM

## 2018-04-26 DIAGNOSIS — E1142 Type 2 diabetes mellitus with diabetic polyneuropathy: Secondary | ICD-10-CM

## 2018-04-26 DIAGNOSIS — J449 Chronic obstructive pulmonary disease, unspecified: Secondary | ICD-10-CM

## 2018-04-26 DIAGNOSIS — M545 Low back pain: Secondary | ICD-10-CM

## 2018-04-26 DIAGNOSIS — E119 Type 2 diabetes mellitus without complications: Secondary | ICD-10-CM

## 2018-04-26 DIAGNOSIS — I10 Essential (primary) hypertension: Secondary | ICD-10-CM

## 2018-04-26 DIAGNOSIS — K219 Gastro-esophageal reflux disease without esophagitis: Secondary | ICD-10-CM

## 2018-04-26 DIAGNOSIS — Z794 Long term (current) use of insulin: Secondary | ICD-10-CM

## 2018-04-26 DIAGNOSIS — F17200 Nicotine dependence, unspecified, uncomplicated: Secondary | ICD-10-CM

## 2018-04-26 DIAGNOSIS — G4700 Insomnia, unspecified: Secondary | ICD-10-CM

## 2018-04-26 DIAGNOSIS — E669 Obesity, unspecified: Secondary | ICD-10-CM

## 2018-04-26 LAB — BAYER DCA HB A1C WAIVED: HB A1C (BAYER DCA - WAIVED): 6.1 % (ref <7.0)

## 2018-04-26 MED ORDER — METFORMIN HCL 1000 MG PO TABS: 1000.0000 mg | ORAL_TABLET | Freq: Two times a day (BID) | ORAL | 3 refills | Status: DC

## 2018-04-26 MED ORDER — GABAPENTIN 600 MG PO TABS: 600.0000 mg | ORAL_TABLET | Freq: Three times a day (TID) | ORAL | 1 refills | Status: DC

## 2018-04-26 NOTE — Patient Instructions (Signed)
Diabetic Neuropathy Diabetic neuropathy is a nerve disease or nerve damage that is caused by diabetes mellitus. About half of all people with diabetes mellitus have some form of nerve damage. Nerve damage is more common in those who have had diabetes mellitus for many years and who generally have not had good control of their blood sugar (glucose) level. Diabetic neuropathy is a common complication of diabetes mellitus. There are three common types of diabetic neuropathy and a fourth type that is less common and less understood:  Peripheral neuropathy-This is the most common type of diabetic neuropathy. It causes damage to the nerves of the feet and legs first and then eventually the hands and arms. The damage affects the ability to sense touch.  Autonomic neuropathy-This type causes damage to the autonomic nervous system, which controls the following functions: ? Heartbeat. ? Body temperature. ? Blood pressure. ? Urination. ? Digestion. ? Sweating. ? Sexual function.  Focal neuropathy-Focal neuropathy can be painful and unpredictable and occurs most often in older adults with diabetes mellitus. It involves a specific nerve or one area and often comes on suddenly. It usually does not cause long-term problems.  Radiculoplexus neuropathy- Sometimes called lumbosacral radiculoplexus neuropathy, radiculoplexus neuropathy affects the nerves of the thighs, hips, buttocks, or legs. It is more common in people with type 2 diabetes mellitus and in older men. It is characterized by debilitating pain, weakness, and atrophy, usually in the thigh muscles.  What are the causes? The cause of peripheral, autonomic, and focal neuropathies is diabetes mellitus that is uncontrolled and high glucose levels. The cause of radiculoplexus neuropathy is unknown. However, it is thought to be caused by inflammation related to uncontrolled glucose levels. What are the signs or symptoms? Peripheral Neuropathy Peripheral  neuropathy develops slowly over time. When the nerves of the feet and legs no longer work there may be:  Burning, stabbing, or aching pain in the legs or feet.  Inability to feel pressure or pain in your feet. This can lead to: ? Thick calluses over pressure areas. ? Pressure sores. ? Ulcers.  Foot deformities.  Reduced ability to feel temperature changes.  Muscle weakness.  Autonomic Neuropathy The symptoms of autonomic neuropathy vary depending on which nerves are affected. Symptoms may include:  Problems with digestion, such as: ? Feeling sick to your stomach (nausea). ? Vomiting. ? Bloating. ? Constipation. ? Diarrhea. ? Abdominal pain.  Difficulty with urination. This occurs if you lose your ability to sense when your bladder is full. Problems include: ? Urine leakage (incontinence). ? Inability to empty your bladder completely (retention).  Rapid or irregular heartbeat (palpitations).  Blood pressure drops when you stand up (orthostatic hypotension). When you stand up you may feel: ? Dizzy. ? Weak. ? Faint.  In men, inability to attain and maintain an erection.  In women, vaginal dryness and problems with decreased sexual desire and arousal.  Problems with body temperature regulation.  Increased or decreased sweating.  Focal Neuropathy  Abnormal eye movements or abnormal alignment of both eyes.  Weakness in the wrist.  Foot drop. This results in an inability to lift the foot properly and abnormal walking or foot movement.  Paralysis on one side of your face (Bell palsy).  Chest or abdominal pain. Radiculoplexus Neuropathy  Sudden, severe pain in your hip, thigh, or buttocks.  Weakness and wasting of thigh muscles.  Difficulty rising from a seated position.  Abdominal swelling.  Unexplained weight loss (usually more than 10 lb [4.5 kg]). How is   this diagnosed? Peripheral Neuropathy Your senses may be tested. Sensory function testing can be  done with:  A light touch using a monofilament.  A vibration with tuning fork.  A sharp sensation with a pin prick.  Other tests that can help diagnose neuropathy are:  Nerve conduction velocity. This test checks the transmission of an electrical current through a nerve.  Electromyography. This shows how muscles respond to electrical signals transmitted by nearby nerves.  Quantitative sensory testing. This is used to assess how your nerves respond to vibrations and changes in temperature.  Autonomic Neuropathy Diagnosis is often based on reported symptoms. Tell your health care provider if you experience:  Dizziness.  Constipation.  Diarrhea.  Inappropriate urination or inability to urinate.  Inability to get or maintain an erection.  Tests that may be done include:  Electrocardiography or Holter monitor. These are tests that can help show problems with the heart rate or heart rhythm.  An X-ray exam may be done.  Focal Neuropathy Diagnosis is made based on your symptoms and what your health care provider finds during your exam. Other tests may be done. They may include:  Nerve conduction velocities. This checks the transmission of electrical current through a nerve.  Electromyography. This shows how muscles respond to electrical signals transmitted by nearby nerves.  Quantitative sensory testing. This test is used to assess how your nerves respond to vibration and changes in temperature.  Radiculoplexus Neuropathy  Often the first thing is to eliminate any other issue or problems that might be the cause, as there is no standard test for diagnosis.  X-ray exam of your spine and lumbar region.  Spinal tap to rule out cancer.  MRI to rule out other lesions. How is this treated? Once nerve damage occurs, it cannot be reversed. The goal of treatment is to keep the disease or nerve damage from getting worse and affecting more nerve fibers. Controlling your blood  glucose level is the key. Most people with radiculoplexus neuropathy see at least a partial improvement over time. You will need to keep your blood glucose and HbA1c levels in the target range determined by your health care provider. Things that help control blood glucose levels include:  Blood glucose monitoring.  Meal planning.  Physical activity.  Diabetes medicine.  Over time, maintaining lower blood glucose levels helps lessen symptoms. Sometimes, prescription pain medicine is needed. Follow these instructions at home:  Do not smoke.  Keep your blood glucose level in the range that you and your health care provider have determined acceptable for you.  Keep your blood pressure level in the range that you and your health care provider have determined acceptable for you.  Eat a well-balanced diet.  Be physically active every day. Include strength training and balance exercises.  Protect your feet. ? Check your feet every day for sores, cuts, blisters, or signs of infection. ? Wear padded socks and supportive shoes. Use orthotic inserts, if necessary. ? Regularly check the insides of your shoes for worn spots. Make sure there are no rocks or other items inside your shoes before you put them on. Contact a health care provider if:  You have burning, stabbing, or aching pain in the legs or feet.  You are unable to feel pressure or pain in your feet.  You develop problems with digestion such as: ? Nausea. ? Vomiting. ? Bloating. ? Constipation. ? Diarrhea. ? Abdominal pain.  You have difficulty with urination, such as: ? Incontinence. ? Retention.    You have palpitations.  You develop orthostatic hypotension. When you stand up you may feel: ? Dizzy. ? Weak. ? Faint.  You cannot attain and maintain an erection (in men).  You have vaginal dryness and problems with decreased sexual desire and arousal (in women).  You have severe pain in your thighs, legs, or  buttocks.  You have unexplained weight loss. This information is not intended to replace advice given to you by your health care provider. Make sure you discuss any questions you have with your health care provider. Document Released: 07/14/2001 Document Revised: 10/11/2015 Document Reviewed: 10/14/2012 Elsevier Interactive Patient Education  2017 [employer]  

## 2018-04-26 NOTE — Progress Notes (Signed)
Subjective:    Patient ID: Cesar Morales, male    DOB: June 01, 1960, 57 y.o.   MRN: [id number]  Chief Complaint  Patient presents with  . Medical Management of Chronic Issues  . Diabetes   Pt presents to the office today for chronic follow up. He has followed with Rheumatologists for RA. He is currently taking Simponi monthly and prednisolone 5 mg . States his pain is still a 10 out 10 in his bilateral hands, feet, and right hip.  Hypertension  This is a chronic problem. The current episode started more than 1 year ago. The problem has been resolved since onset. The problem is controlled. Associated symptoms include malaise/fatigue and peripheral edema. Pertinent negatives include no blurred vision or shortness of breath. Risk factors for coronary artery disease include diabetes mellitus, dyslipidemia, obesity and male gender. The current treatment provides moderate improvement. There is no history of kidney disease, CAD/MI or heart failure.  Diabetes  He presents for his follow-up diabetic visit. He has type 2 diabetes mellitus. His disease course has been worsening. There are no hypoglycemic associated symptoms. Associated symptoms include foot paresthesias. Pertinent negatives for diabetes include no blurred vision. Symptoms are stable. Diabetic complications include peripheral neuropathy. Pertinent negatives for diabetic complications include no heart disease. Risk factors for coronary artery disease include diabetes mellitus, dyslipidemia, male sex and hypertension. He is following a generally unhealthy diet. His overall blood glucose range is >200 mg/dl. Eye exam is not current.  Gastroesophageal Reflux  He reports no belching, no coughing or no heartburn. This is a chronic problem. The current episode started more than 1 year ago. The problem occurs occasionally. The problem has been waxing and waning. Risk factors include obesity. He has tried a PPI for the symptoms. The treatment provided  moderate relief.  Arthritis  Presents for follow-up visit. He complains of pain and joint swelling. The symptoms have been worsening. His pain is at a severity of 10/10.  Diabetic Neuropathy PT currently taking gabapentin 600 mg TID. States he has bilateral burning and aching pain of 10 out 10.     Review of Systems  Constitutional: Positive for malaise/fatigue.  Eyes: Negative for blurred vision.  Respiratory: Negative for cough and shortness of breath.   Gastrointestinal: Negative for heartburn.  Musculoskeletal: Positive for arthritis and joint swelling.  All other systems reviewed and are negative.      Objective:   Physical Exam  Constitutional: He is oriented to person, place, and time. He appears well-developed and well-nourished. No distress.  HENT:  Head: Normocephalic.  Right Ear: External ear normal.  Left Ear: External ear normal.  Mouth/Throat: Oropharynx is clear and moist.  Eyes: Pupils are equal, round, and reactive to light. Right eye exhibits no discharge. Left eye exhibits no discharge.  Neck: Normal range of motion. Neck supple. No thyromegaly present.  Cardiovascular: Normal rate, regular rhythm, normal heart sounds and intact distal pulses.  No murmur heard. Pulmonary/Chest: Effort normal and breath sounds normal. No respiratory distress. He has no wheezes.  Abdominal: Soft. Bowel sounds are normal. He exhibits no distension. There is no tenderness.  Musculoskeletal: He exhibits no edema or tenderness.  Mild swelling bilateral hands, pain in right hip with rotation  Neurological: He is alert and oriented to person, place, and time. He has normal reflexes. No cranial nerve deficit.  Skin: Skin is warm and dry. No rash noted. No erythema.  Psychiatric: He has a normal mood and affect. His behavior is normal. Judgment  and thought content normal.  Vitals reviewed.                 BP 136/86   Pulse 74   Temp (!) 97.1 F (36.2 C) (Oral)   Ht 6' (1.829 m)    Wt 273 lb 6.4 oz (124 kg)   BMI 37.08 kg/m      Assessment & Plan:  Cesar Morales comes in today with chief complaint of Medical Management of Chronic Issues and Diabetes   Diagnosis and orders addressed:  1. Essential hypertension  2. Chronic obstructive pulmonary disease, unspecified COPD type (HCC)  3. Gastroesophageal reflux disease, esophagitis presence not specified  4. Diabetic peripheral neuropathy (HCC) We will increase gabapentin to 4 times a day from TID Need good control of BS - gabapentin (NEURONTIN) 600 MG tablet; Take 1 tablet (600 mg total) by mouth 4 (four) times daily - after meals and at bedtime.  Dispense: 120 tablet; Refill: 1  5. Chronic bilateral low back pain without sciatica  6. Smoker Smoking cessation discussed   7. Obesity (BMI 30-39.9)  8. Insomnia, unspecified type  9. Rheumatoid arthritis involving multiple sites, unspecified rheumatoid factor presence (HCC) Keep Rheumatologists appts If pain is not better, discussed calling Rheumatologists and let them know  10. Non-insulin treated type 2 diabetes mellitus (HCC) We will increase metformin to 1000 mg BID from 500 mg BID.  Since he is taking prednisone daily, strict low carb diet  - Bayer DCA Hb A1c Waived  11. Diabetes mellitus with diabetic polyenuropathy - metFORMIN (GLUCOPHAGE) 1000 MG tablet; Take 1 tablet (1,000 mg total) by mouth 2 (two) times daily with a meal.  Dispense: 180 tablet; Refill: 3   Labs pending Health Maintenance reviewed Diet and exercise encouraged  Follow up plan: 4 months    Jannifer Rodney, FNP

## 2018-06-29 ENCOUNTER — Other Ambulatory Visit: Payer: Self-pay | Admitting: Family

## 2018-06-29 ENCOUNTER — Telehealth: Payer: Self-pay | Admitting: Family

## 2018-06-29 MED ORDER — HYDROCHLOROTHIAZIDE 25 MG PO TABS: 25.0000 mg | ORAL_TABLET | Freq: Every day | ORAL | 3 refills | Status: DC

## 2018-06-29 NOTE — Telephone Encounter (Signed)
Stop Chlorthalidone 25 mg and start hydrochlorothiazide 25 mg. Prescription sent to pharmacy.

## 2018-06-29 NOTE — Telephone Encounter (Signed)
[facility]   PT has been out of work for close to two years and hard to afford the chlorthalidone (HYGROTON) 25 MG tablet at the pharmacy it was going to be $20 and that is with using the good rx card. Wife is wanting to know if something cheaper could be sent in for the pt.

## 2018-06-29 NOTE — Telephone Encounter (Signed)
Patient aware of recommendations.  

## 2018-07-22 ENCOUNTER — Other Ambulatory Visit: Payer: Self-pay | Admitting: Family

## 2018-07-22 DIAGNOSIS — E119 Type 2 diabetes mellitus without complications: Secondary | ICD-10-CM

## 2018-07-28 ENCOUNTER — Ambulatory Visit (INDEPENDENT_AMBULATORY_CARE_PROVIDER_SITE_OTHER): Payer: Self-pay | Admitting: Family

## 2018-07-28 ENCOUNTER — Other Ambulatory Visit: Payer: Self-pay

## 2018-07-28 ENCOUNTER — Encounter: Payer: Self-pay | Admitting: Family

## 2018-07-28 VITALS — BP 140/96 | HR 84 | Temp 80.0°F | Ht 72.0 in | Wt 272.8 lb

## 2018-07-28 DIAGNOSIS — J449 Chronic obstructive pulmonary disease, unspecified: Secondary | ICD-10-CM

## 2018-07-28 DIAGNOSIS — J441 Chronic obstructive pulmonary disease with (acute) exacerbation: Secondary | ICD-10-CM

## 2018-07-28 DIAGNOSIS — F17200 Nicotine dependence, unspecified, uncomplicated: Secondary | ICD-10-CM

## 2018-07-28 MED ORDER — AZITHROMYCIN 250 MG PO TABS: ORAL_TABLET | ORAL | 0 refills | Status: DC

## 2018-07-28 MED ORDER — PREDNISONE 20 MG PO TABS: ORAL_TABLET | ORAL | 0 refills | Status: DC

## 2018-07-28 NOTE — Progress Notes (Signed)
Subjective:    Patient ID: Cesar Morales, male    DOB: 1961-01-16, 58 y.o.   MRN: [id number]  Chief Complaint  Patient presents with  . COPD flare up    Wheezing   This is a recurrent problem. The current episode started 1 to 4 weeks ago. The problem occurs intermittently. The problem has been gradually worsening. Associated symptoms include headaches (" have them all the time") and shortness of breath. Pertinent negatives include no chills, coughing, ear pain, fever, rhinorrhea or sore throat. The symptoms are aggravated by smoke. He has tried rest, oral steroids and OTC cough suppressant for the symptoms. The treatment provided mild relief. His past medical history is significant for COPD.      Review of Systems  Constitutional: Negative for chills and fever.  HENT: Negative for ear pain, rhinorrhea and sore throat.   Respiratory: Positive for shortness of breath and wheezing. Negative for cough.   Neurological: Positive for headaches (" have them all the time").  All other systems reviewed and are negative.      Objective:   Physical Exam Vitals signs reviewed.  Constitutional:      General: He is not in acute distress.    Appearance: He is well-developed.  HENT:     Head: Normocephalic.     Nose: Mucosal edema and rhinorrhea present.     Mouth/Throat:     Pharynx: Posterior oropharyngeal erythema present.  Eyes:     General:        Right eye: No discharge.        Left eye: No discharge.     Pupils: Pupils are equal, round, and reactive to light.  Neck:     Musculoskeletal: Normal range of motion and neck supple.     Thyroid: No thyromegaly.  Cardiovascular:     Rate and Rhythm: Normal rate and regular rhythm.     Heart sounds: Normal heart sounds. No murmur.  Pulmonary:     Effort: Pulmonary effort is normal. No respiratory distress.     Breath sounds: Decreased breath sounds present. No wheezing.  Abdominal:     General: Bowel sounds are normal. There is no  distension.     Palpations: Abdomen is soft.     Tenderness: There is no abdominal tenderness.  Musculoskeletal: Normal range of motion.        General: No tenderness.  Skin:    General: Skin is warm and dry.     Findings: No erythema or rash.  Neurological:     Mental Status: He is alert and oriented to person, place, and time.     Cranial Nerves: No cranial nerve deficit.     Deep Tendon Reflexes: Reflexes are normal and symmetric.  Psychiatric:        Behavior: Behavior normal.        Thought Content: Thought content normal.        Judgment: Judgment normal.       BP (!) 140/96   Pulse 84   Temp (!) 80 F (26.7 C)   Ht 6' (1.829 m)   Wt 272 lb 12.8 oz (123.7 kg)   SpO2 98%   BMI 37.00 kg/m      Assessment & Plan:  Cesar Morales comes in today with chief complaint of COPD flare up   Diagnosis and orders addressed:  1. Chronic obstructive pulmonary disease, unspecified COPD type (HCC)   2. Smoker Smoking cessation discussed  3. COPD exacerbation (HCC) Hold current  prednisone 10 mg daily and start dose pack. Then can resume prednisone  Force fluids Rest Albuterol as needed Keep Rheumatologists appt - predniSONE (DELTASONE) 20 MG tablet; Take 3 tabs daily for 1 week, then 2 tabs for 1 week, then 1 tab for one week  Dispense: 42 tablet; Refill: 0 - azithromycin (ZITHROMAX) 250 MG tablet; Take 500 mg once, then 250 mg for four days  Dispense: 6 tablet; Refill: 0   Jannifer Rodney, FNP

## 2018-07-28 NOTE — Patient Instructions (Signed)
Chronic Obstructive Pulmonary Disease Chronic obstructive pulmonary disease (COPD) is a long-term (chronic) lung problem. When you have COPD, it is hard for air to get in and out of your lungs. Usually the condition gets worse over time, and your lungs will never return to normal. There are things you can do to keep yourself as healthy as possible.  Your doctor may treat your condition with: ? Medicines. ? Oxygen. ? Lung surgery.  Your doctor may also recommend: ? Rehabilitation. This includes steps to make your body work better. It may involve a team of specialists. ? Quitting smoking, if you smoke. ? Exercise and changes to your diet. ? Comfort measures (palliative care). Follow these instructions at home: Medicines  Take over-the-counter and prescription medicines only as told by your doctor.  Talk to your doctor before taking any cough or allergy medicines. You may need to avoid medicines that cause your lungs to be dry. Lifestyle  If you smoke, stop. Smoking makes the problem worse. If you need help quitting, ask your doctor.  Avoid being around things that make your breathing worse. This may include smoke, chemicals, and fumes.  Stay active, but remember to rest as well.  Learn and use tips on how to relax.  Make sure you get enough sleep. Most adults need at least 7 hours of sleep every night.  Eat healthy foods. Eat smaller meals more often. Rest before meals. Controlled breathing Learn and use tips on how to control your breathing as told by your doctor. Try:  Breathing in (inhaling) through your nose for 1 second. Then, pucker your lips and breath out (exhale) through your lips for 2 seconds.  Putting one hand on your belly (abdomen). Breathe in slowly through your nose for 1 second. Your hand on your belly should move out. Pucker your lips and breathe out slowly through your lips. Your hand on your belly should move in as you breathe out.  Controlled coughing Learn  and use controlled coughing to clear mucus from your lungs. Follow these steps: 1. Lean your head a little forward. 2. Breathe in deeply. 3. Try to hold your breath for 3 seconds. 4. Keep your mouth slightly open while coughing 2 times. 5. Spit any mucus out into a tissue. 6. Rest and do the steps again 1 or 2 times as needed. General instructions  Make sure you get all the shots (vaccines) that your doctor recommends. Ask your doctor about a flu shot and a pneumonia shot.  Use oxygen therapy and pulmonary rehabilitation if told by your doctor. If you need home oxygen therapy, ask your doctor if you should buy a tool to measure your oxygen level (oximeter).  Make a COPD action plan with your doctor. This helps you to know what to do if you feel worse than usual.  Manage any other conditions you have as told by your doctor.  Avoid going outside when it is very hot, cold, or humid.  Avoid people who have a sickness you can catch (contagious).  Keep all follow-up visits as told by your doctor. This is important. Contact a doctor if:  You cough up more mucus than usual.  There is a change in the color or thickness of the mucus.  It is harder to breathe than usual.  Your breathing is faster than usual.  You have trouble sleeping.  You need to use your medicines more often than usual.  You have trouble doing your normal activities such as getting dressed   or walking around the house. Get help right away if:  You have shortness of breath while resting.  You have shortness of breath that stops you from: ? Being able to talk. ? Doing normal activities.  Your chest hurts for longer than 5 minutes.  Your skin color is more blue than usual.  Your pulse oximeter shows that you have low oxygen for longer than 5 minutes.  You have a fever.  You feel too tired to breathe normally. Summary  Chronic obstructive pulmonary disease (COPD) is a long-term lung problem.  The way your  lungs work will never return to normal. Usually the condition gets worse over time. There are things you can do to keep yourself as healthy as possible.  Take over-the-counter and prescription medicines only as told by your doctor.  If you smoke, stop. Smoking makes the problem worse. This information is not intended to replace advice given to you by your health care provider. Make sure you discuss any questions you have with your health care provider. Document Released: 10/22/2007 Document Revised: 06/09/2016 Document Reviewed: 06/09/2016 Elsevier Interactive Patient Education  2019 [employer] Inc[employer]  

## 2018-08-13 ENCOUNTER — Telehealth: Payer: Self-pay

## 2018-08-13 NOTE — Telephone Encounter (Signed)
Patient has an elevated PHQ score of 22.  Staff message sent to PCP.    Patient declined [facility] services.  Patient reports that he was recently started on medication and he wanted to see how the medication will help him before he tried anything else.   Patient denies SI/HI/Psychosis/Substance Abuse. If your symptoms worsen or you have thoughts of suicide/homicide, PLEASE SEEK IMMEDIATE MEDICAL ATTENTION.  You may always call:  National Suicide Hotline: (585)272-4910;  [place] Crisis Line: 8580262159;  [facility] in [place]: 857 623 9072.  These are available 24 hours a day, 7 days a week.

## 2018-08-26 ENCOUNTER — Encounter: Payer: Self-pay | Admitting: Family

## 2018-08-26 ENCOUNTER — Other Ambulatory Visit: Payer: Self-pay

## 2018-08-26 ENCOUNTER — Ambulatory Visit (INDEPENDENT_AMBULATORY_CARE_PROVIDER_SITE_OTHER): Payer: Self-pay | Admitting: Family

## 2018-08-26 DIAGNOSIS — M069 Rheumatoid arthritis, unspecified: Secondary | ICD-10-CM

## 2018-08-26 DIAGNOSIS — E669 Obesity, unspecified: Secondary | ICD-10-CM

## 2018-08-26 DIAGNOSIS — J449 Chronic obstructive pulmonary disease, unspecified: Secondary | ICD-10-CM

## 2018-08-26 DIAGNOSIS — K5900 Constipation, unspecified: Secondary | ICD-10-CM

## 2018-08-26 DIAGNOSIS — E785 Hyperlipidemia, unspecified: Secondary | ICD-10-CM

## 2018-08-26 DIAGNOSIS — E1142 Type 2 diabetes mellitus with diabetic polyneuropathy: Secondary | ICD-10-CM

## 2018-08-26 DIAGNOSIS — I25110 Atherosclerotic heart disease of native coronary artery with unstable angina pectoris: Secondary | ICD-10-CM

## 2018-08-26 DIAGNOSIS — K219 Gastro-esophageal reflux disease without esophagitis: Secondary | ICD-10-CM

## 2018-08-26 DIAGNOSIS — F17200 Nicotine dependence, unspecified, uncomplicated: Secondary | ICD-10-CM

## 2018-08-26 DIAGNOSIS — G4700 Insomnia, unspecified: Secondary | ICD-10-CM

## 2018-08-26 DIAGNOSIS — Z8249 Family history of ischemic heart disease and other diseases of the circulatory system: Secondary | ICD-10-CM

## 2018-08-26 DIAGNOSIS — E119 Type 2 diabetes mellitus without complications: Secondary | ICD-10-CM

## 2018-08-26 DIAGNOSIS — F321 Major depressive disorder, single episode, moderate: Secondary | ICD-10-CM

## 2018-08-26 DIAGNOSIS — I10 Essential (primary) hypertension: Secondary | ICD-10-CM

## 2018-08-26 DIAGNOSIS — F411 Generalized anxiety disorder: Secondary | ICD-10-CM

## 2018-08-26 MED ORDER — CITALOPRAM HYDROBROMIDE 40 MG PO TABS: 40.0000 mg | ORAL_TABLET | Freq: Every day | ORAL | 5 refills | Status: DC

## 2018-08-26 NOTE — Progress Notes (Signed)
Virtual Visit via telephone Note  I connected with Georgina Snell on 08/26/18 at 8:18 AM by telephone and verified that I am speaking with the correct person using two identifiers. Cesar Morales is currently located at home and no one is currently with his during visit. The provider, Jannifer Rodney, FNP is located in their office at time of visit.  I discussed the limitations, risks, security and privacy concerns of performing an evaluation and management service by telephone and the availability of in person appointments. I also discussed with the patient that there may be a patient responsible charge related to this service. The patient expressed understanding and agreed to proceed.   History and Present Illness:  Pt presents to the office today for chronic follow up. He has followed with Rheumatologists for RA. He is currently taking Simponi monthly and prednisolone 5 mg . States his pain is still a 10 out 10 in his bilateral hands, feet, and right hip.   PT is followed by Cardiologists annually for CAD.  Hypertension  This is a chronic problem. The current episode started more than 1 year ago. The problem has been waxing and waning since onset. Condition status: 140/90. Associated symptoms include anxiety, malaise/fatigue and shortness of breath (Has COPD). Pertinent negatives include no blurred vision or peripheral edema. Risk factors for coronary artery disease include dyslipidemia, diabetes mellitus, obesity, male gender and sedentary lifestyle. The current treatment provides mild improvement. Hypertensive end-organ damage includes CAD/MI. There is no history of CVA or heart failure.  Gastroesophageal Reflux  He complains of belching and heartburn. He reports no coughing. This is a chronic problem. The current episode started more than 1 year ago. The problem occurs occasionally. Risk factors include obesity. He has tried a PPI for the symptoms. The treatment provided moderate relief.  Diabetes   He presents for his follow-up diabetic visit. He has type 2 diabetes mellitus. His disease course has been stable. Hypoglycemia symptoms include nervousness/anxiousness. Associated symptoms include foot paresthesias. Pertinent negatives for diabetes include no blurred vision and no visual change. There are no hypoglycemic complications. Symptoms are stable. Diabetic complications include heart disease. Pertinent negatives for diabetic complications include no CVA. There are no known risk factors for coronary artery disease. He is following a generally healthy diet. His overall blood glucose range is 130-140 mg/dl. Eye exam is not current.  Arthritis  Presents for follow-up visit. He complains of pain, stiffness and joint swelling. The symptoms have been stable. Affected locations include the right MCP, left MCP, left wrist, right wrist, right knee, left knee, right ankle, left ankle, right toes and left toes. His pain is at a severity of 10/10.  Constipation  This is a chronic problem. The current episode started more than 1 year ago. His stool frequency is 4 to 5 times per week.  Hyperlipidemia  This is a chronic problem. The current episode started more than 1 year ago. The problem is uncontrolled. Recent lipid tests were reviewed and are high. Associated symptoms include shortness of breath (Has COPD).  Insomnia  Primary symptoms: difficulty falling asleep, malaise/fatigue.  The current episode started more than one year. The onset quality is gradual. The problem occurs intermittently. PMH includes: depression.  Depression         This is a chronic problem.  The current episode started more than 1 year ago.   The onset quality is gradual.   The problem occurs intermittently.  The problem has been waxing and waning since onset.  Associated symptoms include decreased concentration, helplessness, hopelessness, insomnia, irritable, restlessness and sad.  Past treatments include SSRIs - Selective  serotonin reuptake inhibitors.  Past medical history includes anxiety.   Anxiety  Presents for follow-up visit. Symptoms include decreased concentration, excessive worry, insomnia, nervous/anxious behavior, restlessness and shortness of breath (Has COPD). Symptoms occur most days. The severity of symptoms is moderate. The quality of sleep is good.    Diabetic Neuropathy PT complains of aching burning pain of 9 out 10. Gabapentin helps slightly.  COPD Continues to smoke 1/2 pack a day. Takes Albuterol as needed.    Review of Systems  Constitutional: Positive for malaise/fatigue.  Eyes: Negative for blurred vision.  Respiratory: Positive for shortness of breath (Has COPD). Negative for cough.   Gastrointestinal: Positive for constipation and heartburn.  Musculoskeletal: Positive for arthritis, joint swelling and stiffness.  Psychiatric/Behavioral: Positive for decreased concentration and depression. The patient is nervous/anxious and has insomnia.       Observations/Objective: No SOB or distress noted  Assessment and Plan: 1. Chronic obstructive pulmonary disease, unspecified COPD type (HCC)  2. Family history of coronary artery disease  3. Essential hypertension  4. Dyslipidemia  5. Diabetic peripheral neuropathy (HCC)  6. Coronary artery disease involving native coronary artery of native heart with unstable angina pectoris (HCC)  7. Constipation, unspecified constipation type  8. Smoker   9. Rheumatoid arthritis involving multiple sites, unspecified rheumatoid factor presence (HCC)  10. Non-insulin treated type 2 diabetes mellitus (HCC)  11. Gastroesophageal reflux disease, esophagitis presence not specified  12. Insomnia, unspecified type  13. Obesity (BMI 30-39.9)  14. GAD (generalized anxiety disorder) - citalopram (CELEXA) 40 MG tablet; Take 1 tablet (40 mg total) by mouth daily.  Dispense: 30 tablet; Refill: 5  15. Depression, major, single episode,  moderate (HCC) - citalopram (CELEXA) 40 MG tablet; Take 1 tablet (40 mg total) by mouth daily.  Dispense: 30 tablet; Refill: 5  Continue medications Will increase Celexa to 40 mg from 20 mg Keep all follow up with Rheumatologists Follow up in 3 months      I discussed the assessment and treatment plan with the patient. The patient was provided an opportunity to ask questions and all were answered. The patient agreed with the plan and demonstrated an understanding of the instructions.   The patient was advised to call back or seek an in-person evaluation if the symptoms worsen or if the condition fails to improve as anticipated.  The above assessment and management plan was discussed with the patient. The patient verbalized understanding of and has agreed to the management plan. Patient is aware to call the clinic if symptoms persist or worsen. Patient is aware when to return to the clinic for a follow-up visit. Patient educated on when it is appropriate to go to the emergency department.    Call ended 8:36 AM, I provided 18  minutes of non-face-to-face time during this encounter.    Jannifer Rodney, FNP

## 2018-11-24 ENCOUNTER — Other Ambulatory Visit: Payer: Self-pay

## 2018-11-25 ENCOUNTER — Ambulatory Visit (INDEPENDENT_AMBULATORY_CARE_PROVIDER_SITE_OTHER): Payer: Self-pay | Admitting: Family

## 2018-11-25 ENCOUNTER — Encounter: Payer: Self-pay | Admitting: Family

## 2018-11-25 VITALS — BP 126/79 | HR 72 | Temp 97.2°F | Ht 72.0 in | Wt 264.0 lb

## 2018-11-25 DIAGNOSIS — F17200 Nicotine dependence, unspecified, uncomplicated: Secondary | ICD-10-CM

## 2018-11-25 DIAGNOSIS — K219 Gastro-esophageal reflux disease without esophagitis: Secondary | ICD-10-CM

## 2018-11-25 DIAGNOSIS — F321 Major depressive disorder, single episode, moderate: Secondary | ICD-10-CM

## 2018-11-25 DIAGNOSIS — G4700 Insomnia, unspecified: Secondary | ICD-10-CM

## 2018-11-25 DIAGNOSIS — I25110 Atherosclerotic heart disease of native coronary artery with unstable angina pectoris: Secondary | ICD-10-CM

## 2018-11-25 DIAGNOSIS — M069 Rheumatoid arthritis, unspecified: Secondary | ICD-10-CM

## 2018-11-25 DIAGNOSIS — J449 Chronic obstructive pulmonary disease, unspecified: Secondary | ICD-10-CM

## 2018-11-25 DIAGNOSIS — E1142 Type 2 diabetes mellitus with diabetic polyneuropathy: Secondary | ICD-10-CM

## 2018-11-25 DIAGNOSIS — E119 Type 2 diabetes mellitus without complications: Secondary | ICD-10-CM

## 2018-11-25 DIAGNOSIS — E785 Hyperlipidemia, unspecified: Secondary | ICD-10-CM

## 2018-11-25 DIAGNOSIS — I10 Essential (primary) hypertension: Secondary | ICD-10-CM

## 2018-11-25 LAB — BAYER DCA HB A1C WAIVED: HB A1C (BAYER DCA - WAIVED): 5.7 % (ref <7.0)

## 2018-11-25 NOTE — Patient Instructions (Signed)
Rheumatoid Arthritis Rheumatoid arthritis (RA) is a long-term (chronic) disease that causes inflammation in your joints. RA may start slowly. It most often affects the small joints of the hands and feet. Usually, the same joints are affected on both sides of your body. Inflammation from RA can also affect other parts of your body, including your heart, eyes, or lungs. There is no cure for RA, but medicines can help your symptoms and halt or slow down the progression of the disease. What are the causes? RA is an autoimmune disease. When you have an autoimmune disease, your body's defense system (immune system) mistakenly attacks healthy body tissues. The exact cause of RA is not known. What increases the risk? You are more likely to develop this condition if you:  Are a woman.  Have a family history of of RA or other autoimmune diseases.  Have a history of smoking.  Are obese.  Have been exposed to pollutants or chemicals. What are the signs or symptoms? The first symptom of this condition may be morning stiffness that lasts longer than 30 minutes.  Symptoms usually start gradually. They are often worse in the morning. As RA progresses, symptoms may include:  Pain, stiffness, swelling, warmth, and tenderness in joints on both sides of your body.  Loss of energy.  Loss of appetite.  Weight loss.  Low-grade fever.  Dry eyes and dry mouth.  Firm lumps (rheumatoid nodules) that grow beneath your skin in areas such as your forearm bones near your elbows and on your hands.  Changes in the appearance of joints (deformity) and loss of joint function. Symptoms of this condition vary from person to person.  Symptoms of RA often come and go.  Sometimes, symptoms get worse for a period of time. These are called flares. How is this diagnosed? This condition is diagnosed based on your symptoms, medical history, and physical exam.  You may have X-rays or an MRI to check for the type of  joint changes that are caused by RA. You may also have blood tests to look for:  Proteins (antibodies) that your immune system may make if you have RA. These include rheumatoid factor (RF) and anti-CCP. ? When blood tests show these proteins, you are said to have "seropositive RA." ? When blood tests do not show these proteins, you may have "seronegative RA."  Inflammation in your blood.  A low number of red blood cells (anemia). How is this treated? The goals of treatment are to relieve pain, reduce inflammation, and slow down or stop joint damage and disability. Treatment may include:  Lifestyle changes. It is important to rest as needed, eat a healthy diet, and exercise.  Medicines. Your health care provider may adjust your medicines every 3 months until treatment goals are reached. Common medicines include: ? Pain relievers (analgesics). ? Corticosteroids and NSAIDs to reduce inflammation. ? Disease-modifying antirheumatic drugs (DMARDs) to try to slow the course of the disease. ? Biologic response modifiers to reduce inflammation and damage.  Physical therapy and occupational therapy.  Surgery, if you have severe joint damage. Joint replacement or fusing of joints may be needed. Your health care provider will work with you to identify the best treatment option for you based on assessment of the overall disease activity in your body. Follow these instructions at home: Activity  Return to your normal activities as told by your health care provider. Ask your health care provider what activities are safe for you.  Rest when you are having   a flare.  Start an exercise program as told by your health care provider. General instructions  Keep all follow-up visits as told by your health care provider. This is important.  Take over-the-counter and prescription medicines only as told by your health care provider. Where to find more information  [employer]:  www.rheumatology.org  Arthritis Foundation: www.arthritis.org Contact a health care provider if:  You have a flare-up of RA symptoms.  You have a fever.  You have side effects from your medicines. Get help right away if:  You have chest pain.  You have trouble breathing.  You quickly develop a hot, painful joint that is more severe than your usual joint aches. Summary  Rheumatoid arthritis (RA) is a long-term (chronic) disease that causes inflammation in your joints.  RA is an autoimmune disease.  The goals of treatment are to relieve pain, reduce inflammation, and slow down or stop joint damage and disability. This information is not intended to replace advice given to you by your health care provider. Make sure you discuss any questions you have with your health care provider. Document Released: 05/02/2000 Document Revised: 01/05/2018 Document Reviewed: 01/05/2018 Elsevier Patient Education  2020 [employer]  

## 2018-11-25 NOTE — Progress Notes (Signed)
Subjective:    Patient ID: Cesar Morales, male    DOB: [birth year], 58 y.o.   MRN: [id number]  Chief Complaint  Patient presents with  . Medical Management of Chronic Issues   Pt presents to the office today for chronic follow up. He has followed with Rheumatologists for RA. He is currently taking Simponi monthly. States his pain is a 6-7 out 10 in his bilateral hands, feet, and right hip.  PT is followed by Cardiologists annually for CAD. Hypertension This is a chronic problem. The current episode started more than 1 year ago. The problem has been resolved since onset. The problem is controlled. Associated symptoms include malaise/fatigue and peripheral edema. Pertinent negatives include no blurred vision or shortness of breath. Risk factors for coronary artery disease include dyslipidemia, diabetes mellitus, obesity, male gender and sedentary lifestyle. The current treatment provides moderate improvement. Hypertensive end-organ damage includes CAD/MI.  Gastroesophageal Reflux He complains of heartburn. He reports no belching or no coughing. This is a chronic problem. The current episode started more than 1 year ago. The problem occurs occasionally. The problem has been waxing and waning. Associated symptoms include fatigue. He has tried a PPI for the symptoms. The treatment provided moderate relief.  Arthritis Presents for follow-up visit. He complains of pain and stiffness. The symptoms have been worsening. Affected locations include the right MCP, left MCP, left hip, right hip, right knee and left knee. His pain is at a severity of 6/10. Associated symptoms include fatigue.  Diabetes He presents for his follow-up diabetic visit. He has type 2 diabetes mellitus. There are no hypoglycemic associated symptoms. Associated symptoms include fatigue, foot paresthesias and visual change. Pertinent negatives for diabetes include no blurred vision. Symptoms are stable. Diabetic complications include  heart disease. Risk factors for coronary artery disease include dyslipidemia, diabetes mellitus, hypertension, male sex and sedentary lifestyle. He is following a generally unhealthy diet. His overall blood glucose range is 130-140 mg/dl.  Depression        This is a chronic problem.  The current episode started more than 1 year ago.   The onset quality is gradual.   The problem occurs intermittently.  Associated symptoms include fatigue, insomnia, irritable, restlessness, decreased interest and sad.  Associated symptoms include no helplessness and no hopelessness.  Past treatments include SSRIs - Selective serotonin reuptake inhibitors.  Compliance with treatment is good. Insomnia Primary symptoms: difficulty falling asleep, frequent awakening, malaise/fatigue.  The current episode started more than one year. The onset quality is gradual. The problem occurs intermittently. The problem has been waxing and waning since onset. PMH includes: depression.  Diabetic Neuropathy Pt states he has constant aching and burning pain of 10 out 10. He takes gabapentin 600 mg TID.  COPD Pt continues to smoke 3-4 cigarettes a day. He states he has used his wife's Trelegy that helped. He currently does not have insurance and can not afford this medication.    Review of Systems  Constitutional: Positive for fatigue and malaise/fatigue.  Eyes: Negative for blurred vision.  Respiratory: Negative for cough and shortness of breath.   Gastrointestinal: Positive for heartburn.  Musculoskeletal: Positive for arthritis and stiffness.  Psychiatric/Behavioral: Positive for depression. The patient has insomnia.   All other systems reviewed and are negative.      Objective:   Physical Exam Vitals signs reviewed.  Constitutional:      General: He is irritable. He is not in acute distress.    Appearance: He is well-developed.  HENT:  Head: Normocephalic.     Right Ear: Tympanic membrane normal.     Left Ear:  Tympanic membrane normal.  Eyes:     General:        Right eye: No discharge.        Left eye: No discharge.     Pupils: Pupils are equal, round, and reactive to light.  Neck:     Musculoskeletal: Normal range of motion and neck supple.     Thyroid: No thyromegaly.  Cardiovascular:     Rate and Rhythm: Normal rate and regular rhythm.     Heart sounds: Normal heart sounds. No murmur.  Pulmonary:     Effort: Pulmonary effort is normal. No respiratory distress.     Breath sounds: Normal breath sounds. No wheezing.  Abdominal:     General: Bowel sounds are normal. There is no distension.     Palpations: Abdomen is soft.     Tenderness: There is no abdominal tenderness.  Musculoskeletal:        General: Swelling and tenderness present.     Comments: Pain in bilateral hands with gripping  Skin:    General: Skin is warm and dry.     Findings: No erythema or rash.  Neurological:     Mental Status: He is alert and oriented to person, place, and time.     Cranial Nerves: No cranial nerve deficit.     Deep Tendon Reflexes: Reflexes are normal and symmetric.  Psychiatric:        Behavior: Behavior normal.        Thought Content: Thought content normal.        Judgment: Judgment normal.       BP 126/79   Pulse 72   Temp (!) 97.2 F (36.2 C) (Oral)   Ht 6' (1.829 m)   Wt 264 lb (119.7 kg)   BMI 35.80 kg/m      Assessment & Plan:  Cesar Morales comes in today with chief complaint of Medical Management of Chronic Issues   Diagnosis and orders addressed:  1. Essential hypertension - CMP14+EGFR - CBC with Differential/Platelet  2. Coronary artery disease involving native coronary artery of native heart with unstable angina pectoris (HCC) - CMP14+EGFR - CBC with Differential/Platelet  3. Gastroesophageal reflux disease, esophagitis presence not specified - CMP14+EGFR - CBC with Differential/Platelet  4. Chronic obstructive pulmonary disease, unspecified COPD type ([place]) -  CMP14+EGFR - CBC with Differential/Platelet  5. Diabetic peripheral neuropathy (HCC) - CMP14+EGFR - CBC with Differential/Platelet  6. Non-insulin treated type 2 diabetes mellitus ([place]) - CMP14+EGFR - Bayer DCA Hb A1c Waived - CBC with Differential/Platelet  7. Rheumatoid arthritis involving multiple sites, unspecified rheumatoid factor presence (HCC) - CMP14+EGFR - CBC with Differential/Platelet  8. Depression, major, single episode, moderate (HCC) - CMP14+EGFR - CBC with Differential/Platelet  9. Dyslipidemia - CMP14+EGFR - CBC with Differential/Platelet  10. Insomnia, unspecified type - CMP14+EGFR - CBC with Differential/Platelet  11. Morbid obesity ([place]) - CMP14+EGFR - CBC with Differential/Platelet  12. Smoker Smoking cessation discussed - CMP14+EGFR - CBC with Differential/Platelet   Labs pending Health Maintenance reviewed-Pt is self pay and will hold off at this time Diet and exercise encouraged  Follow up plan: 6 months    Evelina Dun, FNP

## 2018-11-26 LAB — CMP14+EGFR
ALT: 16 IU/L (ref 0–44)
AST: 17 IU/L (ref 0–40)
Albumin/Globulin Ratio: 1.9 (ref 1.2–2.2)
Albumin: 4.7 g/dL (ref 3.8–4.9)
Alkaline Phosphatase: 101 IU/L (ref 39–117)
BUN/Creatinine Ratio: 11 (ref 9–20)
BUN: 11 mg/dL (ref 6–24)
Bilirubin Total: 0.4 mg/dL (ref 0.0–1.2)
CO2: 20 mmol/L (ref 20–29)
Calcium: 9.5 mg/dL (ref 8.7–10.2)
Chloride: 102 mmol/L (ref 96–106)
Creatinine, Ser: 0.98 mg/dL (ref 0.76–1.27)
GFR calc Af Amer: 98 mL/min/{1.73_m2} (ref >59)
GFR calc non Af Amer: 85 mL/min/{1.73_m2} (ref >59)
Globulin, Total: 2.5 g/dL (ref 1.5–4.5)
Glucose: 115 mg/dL — ABNORMAL HIGH (ref 65–99)
Potassium: 4.4 mmol/L (ref 3.5–5.2)
Sodium: 139 mmol/L (ref 134–144)
Total Protein: 7.2 g/dL (ref 6.0–8.5)

## 2018-11-26 LAB — CBC WITH DIFFERENTIAL/PLATELET
Basophils Absolute: 0.1 10*3/uL (ref 0.0–0.2)
Basos: 1 %
EOS (ABSOLUTE): 0.2 10*3/uL (ref 0.0–0.4)
Eos: 2 %
Hematocrit: 48.6 % (ref 37.5–51.0)
Hemoglobin: 16.5 g/dL (ref 13.0–17.7)
Immature Grans (Abs): 0 10*3/uL (ref 0.0–0.1)
Immature Granulocytes: 0 %
Lymphocytes Absolute: 2.2 10*3/uL (ref 0.7–3.1)
Lymphs: 24 %
MCH: 29.7 pg (ref 26.6–33.0)
MCHC: 34 g/dL (ref 31.5–35.7)
MCV: 87 fL (ref 79–97)
Monocytes Absolute: 0.5 10*3/uL (ref 0.1–0.9)
Monocytes: 5 %
Neutrophils Absolute: 6.2 10*3/uL (ref 1.4–7.0)
Neutrophils: 68 %
Platelets: 276 10*3/uL (ref 150–450)
RBC: 5.56 x10E6/uL (ref 4.14–5.80)
RDW: 12.9 % (ref 11.6–15.4)
WBC: 9.2 10*3/uL (ref 3.4–10.8)

## 2018-12-01 ENCOUNTER — Telehealth: Payer: Self-pay | Admitting: Family

## 2018-12-01 NOTE — Telephone Encounter (Signed)
Pt requesting DOT PE before Monday No appts available Pt notified

## 2019-01-09 ENCOUNTER — Encounter (HOSPITAL_COMMUNITY): Payer: Self-pay | Admitting: Internal Medicine

## 2019-01-09 ENCOUNTER — Inpatient Hospital Stay (HOSPITAL_COMMUNITY)
Admission: AD | Admit: 2019-01-09 | Discharge: 2019-01-11 | DRG: 303 | Disposition: A | Discharge status: Home / Self Care | Payer: Self-pay | Source: Other Acute Inpatient Hospital | Attending: Family Medicine | Admitting: Family Medicine

## 2019-01-09 DIAGNOSIS — F17210 Nicotine dependence, cigarettes, uncomplicated: Secondary | ICD-10-CM | POA: Diagnosis present

## 2019-01-09 DIAGNOSIS — E785 Hyperlipidemia, unspecified: Secondary | ICD-10-CM

## 2019-01-09 DIAGNOSIS — R911 Solitary pulmonary nodule: Secondary | ICD-10-CM | POA: Diagnosis present

## 2019-01-09 DIAGNOSIS — J449 Chronic obstructive pulmonary disease, unspecified: Secondary | ICD-10-CM

## 2019-01-09 DIAGNOSIS — Z7982 Long term (current) use of aspirin: Secondary | ICD-10-CM

## 2019-01-09 DIAGNOSIS — I10 Essential (primary) hypertension: Secondary | ICD-10-CM

## 2019-01-09 DIAGNOSIS — K219 Gastro-esophageal reflux disease without esophagitis: Secondary | ICD-10-CM | POA: Diagnosis present

## 2019-01-09 DIAGNOSIS — Z20828 Contact with and (suspected) exposure to other viral communicable diseases: Secondary | ICD-10-CM | POA: Diagnosis present

## 2019-01-09 DIAGNOSIS — M069 Rheumatoid arthritis, unspecified: Secondary | ICD-10-CM | POA: Diagnosis present

## 2019-01-09 DIAGNOSIS — I25110 Atherosclerotic heart disease of native coronary artery with unstable angina pectoris: Principal | ICD-10-CM | POA: Diagnosis present

## 2019-01-09 DIAGNOSIS — I2510 Atherosclerotic heart disease of native coronary artery without angina pectoris: Secondary | ICD-10-CM | POA: Diagnosis present

## 2019-01-09 DIAGNOSIS — E119 Type 2 diabetes mellitus without complications: Secondary | ICD-10-CM

## 2019-01-09 DIAGNOSIS — Z720 Tobacco use: Secondary | ICD-10-CM | POA: Diagnosis present

## 2019-01-09 DIAGNOSIS — G4700 Insomnia, unspecified: Secondary | ICD-10-CM | POA: Diagnosis present

## 2019-01-09 DIAGNOSIS — R079 Chest pain, unspecified: Secondary | ICD-10-CM

## 2019-01-09 DIAGNOSIS — I25119 Atherosclerotic heart disease of native coronary artery with unspecified angina pectoris: Secondary | ICD-10-CM

## 2019-01-09 DIAGNOSIS — E1142 Type 2 diabetes mellitus with diabetic polyneuropathy: Secondary | ICD-10-CM | POA: Diagnosis present

## 2019-01-09 DIAGNOSIS — Z7984 Long term (current) use of oral hypoglycemic drugs: Secondary | ICD-10-CM

## 2019-01-09 DIAGNOSIS — Z79899 Other long term (current) drug therapy: Secondary | ICD-10-CM

## 2019-01-09 DIAGNOSIS — F419 Anxiety disorder, unspecified: Secondary | ICD-10-CM | POA: Diagnosis present

## 2019-01-09 HISTORY — DX: Tobacco use: Z72.0

## 2019-01-09 LAB — TROPONIN I (HIGH SENSITIVITY): Troponin I (High Sensitivity): 7 ng/L (ref <18)

## 2019-01-09 MED ORDER — ALBUTEROL SULFATE (2.5 MG/3ML) 0.083% IN NEBU: 3.0000 mL | INHALATION_SOLUTION | Freq: Four times a day (QID) | RESPIRATORY_TRACT | Status: DC | PRN

## 2019-01-09 MED ORDER — MORPHINE SULFATE (PF) 2 MG/ML IV SOLN
1.0000 mg | INTRAVENOUS | Status: DC | PRN
  Administered 2019-01-09 – 2019-01-11 (×7): 1 mg via INTRAVENOUS
  Filled 2019-01-09 (×8): qty 1

## 2019-01-09 MED ORDER — CITALOPRAM HYDROBROMIDE 20 MG PO TABS
40.0000 mg | ORAL_TABLET | Freq: Every day | ORAL | Status: DC
  Administered 2019-01-10 – 2019-01-11 (×2): 40 mg via ORAL
  Filled 2019-01-09 (×2): qty 2

## 2019-01-09 MED ORDER — CARVEDILOL 6.25 MG PO TABS
6.2500 mg | ORAL_TABLET | Freq: Two times a day (BID) | ORAL | Status: DC
  Administered 2019-01-10 – 2019-01-11 (×2): 6.25 mg via ORAL
  Filled 2019-01-09 (×3): qty 1

## 2019-01-09 MED ORDER — NITROGLYCERIN IN D5W 200-5 MCG/ML-% IV SOLN
0.0000 ug/min | INTRAVENOUS | Status: DC
  Administered 2019-01-09: 5 ug/min via INTRAVENOUS
  Administered 2019-01-10: 10 ug/min via INTRAVENOUS
  Filled 2019-01-09: qty 250

## 2019-01-09 MED ORDER — TRAZODONE HCL 50 MG PO TABS: 50.0000 mg | ORAL_TABLET | Freq: Every evening | ORAL | Status: DC | PRN

## 2019-01-09 MED ORDER — GABAPENTIN 600 MG PO TABS
600.0000 mg | ORAL_TABLET | Freq: Three times a day (TID) | ORAL | Status: DC
  Administered 2019-01-10 – 2019-01-11 (×6): 600 mg via ORAL
  Filled 2019-01-09 (×6): qty 1

## 2019-01-09 MED ORDER — ACETAMINOPHEN 650 MG RE SUPP: 650.0000 mg | Freq: Four times a day (QID) | RECTAL | Status: DC | PRN

## 2019-01-09 MED ORDER — SIMVASTATIN 20 MG PO TABS
20.0000 mg | ORAL_TABLET | Freq: Once | ORAL | Status: AC
  Administered 2019-01-10: 20 mg via ORAL
  Filled 2019-01-09: qty 1

## 2019-01-09 MED ORDER — ACETAMINOPHEN 325 MG PO TABS
650.0000 mg | ORAL_TABLET | Freq: Four times a day (QID) | ORAL | Status: DC | PRN
  Administered 2019-01-11: 05:00:00 650 mg via ORAL
  Filled 2019-01-09: qty 2

## 2019-01-09 MED ORDER — SODIUM CHLORIDE 0.9 % IV SOLN
INTRAVENOUS | Status: AC
  Administered 2019-01-10: via INTRAVENOUS

## 2019-01-09 MED ORDER — HYDROCHLOROTHIAZIDE 25 MG PO TABS
25.0000 mg | ORAL_TABLET | Freq: Every day | ORAL | Status: DC
  Administered 2019-01-10 – 2019-01-11 (×2): 25 mg via ORAL
  Filled 2019-01-09 (×2): qty 1

## 2019-01-09 MED ORDER — ASPIRIN EC 81 MG PO TBEC
81.0000 mg | DELAYED_RELEASE_TABLET | Freq: Every day | ORAL | Status: DC
  Administered 2019-01-10 – 2019-01-11 (×2): 81 mg via ORAL
  Filled 2019-01-09 (×2): qty 1

## 2019-01-09 MED ORDER — PANTOPRAZOLE SODIUM 40 MG PO TBEC
40.0000 mg | DELAYED_RELEASE_TABLET | Freq: Every day | ORAL | Status: DC
  Administered 2019-01-10 – 2019-01-11 (×2): 40 mg via ORAL
  Filled 2019-01-09 (×2): qty 1

## 2019-01-09 MED ORDER — INSULIN ASPART 100 UNIT/ML ~~LOC~~ SOLN
0.0000 [IU] | SUBCUTANEOUS | Status: DC
  Administered 2019-01-10: 1 [IU] via SUBCUTANEOUS

## 2019-01-09 NOTE — H&P (Addendum)
TRH H&P    Patient Demographics:    Cesar Morales, is a 58 y.o. male  MRN: [id number]  DOB - 07/31/1960  Admit Date - 01/09/2019  Referring MD/NP/PA:  ER at [facility] ? Dr. Doreene Eland  Outpatient Primary MD for the patient is Junie Spencer, FNP Holley Bouche (cardiology)   Patient coming from:  Home-> [facility] ER  Chief complaint-  Chest pain   HPI:    Cesar Morales  is a 57 y.o. male,  w hypertension, hyperlipidemia, Dm2, w neuropathy, CAD, Copd not on home o2, apparently presents with c/o chest pain  " substernal  chest pressure" w radiation to the back starting yesterday afternoon while bushhogging.  Associated with sob.  Pt took slg nitro w minimal relief.  He thought it would go away, but continued to have chest pain, and woke up w pain in the AM.  Due to the pain not improving with more slg nitro patient presented to ER at [facility].    In ED,  Temperature 36.7 C pulse 69 respirations 20 blood pressure 158/92 pulse ox 98% on room air  CTA chest impression: Negative for pulmonary embolism, 4 mm pulmonary nodule of the dependent superior segment of the right lower lobe in series 7, image 41.  CT follow-up is optional for this nodule at 12 months of high risk factor for lung cancer are present.  Coronary artery disease.  Sodium 136 potassium 4.2 BUN 13 creatinine 0.77 glucose 99 calcium 9.8 magnesium 2.1 AST 14 ALT 15 alkaline phosphatase 83, total bilirubin 0.3  Troponin I less than 0.01- less than 0.01 BNP 214.7, normal INR 0.9, d-dimer 0.75 WBC 10.3, hemoglobin 15.6 platelet count 243  EKG normal sinus rhythm at 70, borderline left axis deviation, normal intervals, no ST T-segment changes consistent with acute ischemia  Patient was treated with Nitropaste, morphine IV x2 and aspirin in the ED.  Due to lack of cardiology coverage patient was transferred to [facility].  Cardiac  catheterization 02/24/2017  Ost 1st Mrg to 1st Mrg lesion, 85 %stenosed.  Ost RPDA to RPDA lesion, 35 %stenosed.  Mid LAD-1 lesion, 20 %stenosed. Mid LAD-2 lesion, 35 %stenosed.  Ost 2nd Mrg lesion, 40 %stenosed.  There is mild left ventricular systolic dysfunction.  The left ventricular ejection fraction is 45-50% by visual estimate. Cannot exclude mild anterior hypokinesis - suggest 2 D Echo.   The patient does not have small arteries. He has large patulous vessels, but what may be causing symptoms is the small caliber OM1 branch which does appear to have a severe lesion proximally. Unfortunately this is an ostial lesion all branches that therefore not a good PCI target.      Review of systems:    In addition to the HPI above,  No Fever-chills, No Headache, No changes with Vision or hearing, No problems swallowing food or Liquids, No  Cough   No Abdominal pain, No Nausea or Vomiting, bowel movements are regular, No Blood in stool or Urine, No dysuria, No new skin rashes  or bruises, No new joints pains-aches,  No new weakness, tingling, numbness in any extremity, No recent weight gain or loss, No polyuria, polydypsia or polyphagia, No significant Mental Stressors.  All other systems reviewed and are negative.    Past History of the following :    Past Medical History:  Diagnosis Date  . COPD (chronic obstructive pulmonary disease) ([place])   . Coronary artery disease   . Diabetes mellitus without complication ([place])   . Dyslipidemia   . HTN (hypertension)       Past Surgical History:  Procedure Laterality Date  . CHOLECYSTECTOMY    . HERNIA REPAIR    . LEFT HEART CATH AND CORONARY ANGIOGRAPHY N/A 02/24/2017   Procedure: LEFT HEART CATH AND CORONARY ANGIOGRAPHY;  Surgeon: Leonie Man, MD;  Location: [place] [facility];  Service: Cardiovascular;  Laterality: N/A;      Social History:      Social History   Tobacco Use  . Smoking status: Current Every  Day Smoker    Packs/day: 0.50  . Smokeless tobacco: Never Used  Substance Use Topics  . Alcohol use: No       Family History :     Family History  Problem Relation Age of Onset  . Heart disease Mother 39       Died age 21  . Heart disease Father        Atrial fib  . Cancer Father        Home Medications:   Prior to Admission medications   Medication Sig Start Date End Date Taking? Authorizing Provider  albuterol (PROVENTIL HFA;VENTOLIN HFA) 108 (90 Base) MCG/ACT inhaler Inhale 2 puffs into the lungs every 6 (six) hours as needed for wheezing or shortness of breath. 03/08/18   Evelina Dun A, FNP  aspirin EC 81 MG tablet Take 81 mg by mouth daily.    [provider]  bisacodyl (DULCOLAX) 5 MG EC tablet Take 1 tablet (5 mg total) by mouth 2 (two) times daily. 07/04/17   Gareth Morgan, MD  citalopram (CELEXA) 40 MG tablet Take 1 tablet (40 mg total) by mouth daily. 08/26/18   Sharion Balloon, FNP  dexlansoprazole (DEXILANT) 60 MG capsule Take 1 capsule (60 mg total) by mouth daily. 08/25/16   Sharion Balloon, FNP  gabapentin (NEURONTIN) 600 MG tablet Take 1 tablet (600 mg total) by mouth 4 (four) times daily - after meals and at bedtime. 04/26/18   Evelina Dun A, FNP  Golimumab (SIMPONI) 50 MG/0.5ML SOAJ Inject into the skin.    [provider]  hydrochlorothiazide (HYDRODIURIL) 25 MG tablet Take 1 tablet (25 mg total) by mouth daily. 06/29/18   Sharion Balloon, FNP  isosorbide mononitrate (IMDUR) 120 MG 24 hr tablet TAKE 1 TABLET DAILY 06/02/17   Minus Breeding, MD  metFORMIN (GLUCOPHAGE) 1000 MG tablet Take 1 tablet (1,000 mg total) by mouth 2 (two) times daily with a meal. 04/26/18   Hawks, Alyse Low A, FNP  naproxen (NAPROSYN) 500 MG tablet Take 1 tablet (500 mg total) by mouth 2 (two) times daily with a meal. 03/08/18   Hawks, Alyse Low A, FNP  nitroGLYCERIN (NITROSTAT) 0.4 MG SL tablet Place 1 tablet (0.4 mg total) under the tongue every 5 (five) minutes as  needed for chest pain. 02/24/17   Duke, Tami Lin, PA  traZODone (DESYREL) 50 MG tablet Take 1-2 tablets (50-100 mg total) by mouth at bedtime as needed for sleep. 03/08/18   Sharion Balloon,  FNP     Allergies:     Allergies  Allergen Reactions  . Lisinopril Swelling    angioedema  . Lipitor [Atorvastatin]     Stomach cramps     Physical Exam:   Vitals  Blood pressure (!) 151/82, pulse 61, temperature 98.2 F (36.8 C), temperature source Oral, resp. rate 18, height 6' (1.829 m), weight 118.4 kg, SpO2 92 %.  1.  General: Alert and oriented x3  2. Psychiatric: Euthymic  3. Neurologic: Nonfocal  4. HEENMT:  Anicteric, pupils 1.5 mm, symmetric direct consensual reflexes intact Neck no JVD no bruit  5. Respiratory : Slight crackles at bilateral bases, no wheezing  6. Cardiovascular : Regular rate rhythm S1-S2 no murmurs gallops or rubs  7. Gastrointestinal:  Abdomen: Soft nontender nondistended positive bowel sounds, obese  8. Skin:  Extremities: No cyanosis clubbing or edema  9.Musculoskeletal:  Good range of motion    Data Review:    CBC No results for input(s): WBC, HGB, HCT, PLT, MCV, MCH, MCHC, RDW, LYMPHSABS, MONOABS, EOSABS, BASOSABS, BANDABS in the last 168 hours.  Invalid input(s): NEUTRABS, BANDSABD ------------------------------------------------------------------------------------------------------------------  No results found for this or any previous visit (from the past 48 hour(s)).  Chemistries  No results for input(s): NA, K, CL, CO2, GLUCOSE, BUN, CREATININE, CALCIUM, MG, AST, ALT, ALKPHOS, BILITOT in the last 168 hours.  Invalid input(s): GFRCGP ------------------------------------------------------------------------------------------------------------------  ------------------------------------------------------------------------------------------------------------------ GFR: CrCl cannot be calculated (Patient's most recent lab  result is older than the maximum 21 days allowed.). Liver Function Tests: No results for input(s): AST, ALT, ALKPHOS, BILITOT, PROT, ALBUMIN in the last 168 hours. No results for input(s): LIPASE, AMYLASE in the last 168 hours. No results for input(s): AMMONIA in the last 168 hours. Coagulation Profile: No results for input(s): INR, PROTIME in the last 168 hours. Cardiac Enzymes: No results for input(s): CKTOTAL, CKMB, CKMBINDEX, TROPONINI in the last 168 hours. BNP (last 3 results) No results for input(s): PROBNP in the last 8760 hours. HbA1C: No results for input(s): HGBA1C in the last 72 hours. CBG: No results for input(s): GLUCAP in the last 168 hours. Lipid Profile: No results for input(s): CHOL, HDL, LDLCALC, TRIG, CHOLHDL, LDLDIRECT in the last 72 hours. Thyroid Function Tests: No results for input(s): TSH, T4TOTAL, FREET4, T3FREE, THYROIDAB in the last 72 hours. Anemia Panel: No results for input(s): VITAMINB12, FOLATE, FERRITIN, TIBC, IRON, RETICCTPCT in the last 72 hours.  --------------------------------------------------------------------------------------------------------------- Urine analysis:    Component Value Date/Time   COLORURINE YELLOW 07/04/2017 1854   APPEARANCEUR Negative 11/13/2017 0929   LABSPEC >1.046 (H) 07/04/2017 1854   PHURINE 7.0 07/04/2017 1854   GLUCOSEU Negative 11/13/2017 0929   HGBUR LARGE (A) 07/04/2017 1854   BILIRUBINUR Negative 11/13/2017 0929   KETONESUR 5 (A) 07/04/2017 1854   PROTEINUR Negative 11/13/2017 0929   PROTEINUR NEGATIVE 07/04/2017 1854   NITRITE Negative 11/13/2017 0929   NITRITE NEGATIVE 07/04/2017 1854   LEUKOCYTESUR Negative 11/13/2017 0929      Imaging Results:    No results found.    Assessment & Plan:    Principal Problem:   Chest pain Active Problems:   Non-insulin treated type 2 diabetes mellitus (HCC)   Coronary artery disease   Chronic obstructive pulmonary disease (HCC)   Dyslipidemia   Diabetic  peripheral neuropathy (HCC)   Essential hypertension   Pulmonary nodule  Chest pain, unstable angina Telemetry Check troponin I every 3 hours x2 Check cardiac echo N.p.o. after midnight Continue aspirin Trial of Simvastatin 20mg  po x1  since allerghic to Lipitor Start Carvedilol 6.25mg  po bid DC Imdur, DC Nitropaste-> Nitro gtt., morphine sulfate 1mg  iv q4h prn  Per cardiology fellow, low risk no heparin Gtt needed Spoke with cardiology fellow, pt on list to be seen in AM, appreciate input.   Dm2 DC Metformin in case needs cardiac catheterization fsbs q4h, ISS  Diabetic neuropathy Cont Gabapentin  Hypertension Cont hydrochlorothiaizide 25mg  po qday  Gerd Cont PPI Consider GI consultation if cardiac evaluation negative  RA DC Naproxen for now DC SImponi for now  Copd  Cont Albuterol prn  Anxiety Cont Celexa 40mg  po qday  Insomnia Trazodone 50mg  po qhs prn   Pulmonary nodule in smoker Notified patient that radiology recommended f/u CT chest in 12 months.  Also notified his wife of this finding  DVT Prophylaxis-   Heparin gtt  AM Labs Ordered, also please review Full Orders  Family Communication: Admission, patients condition and plan of care including tests being ordered have been discussed with the patient  who indicate understanding and agree with the plan and Code Status.  Code Status:  FULL CODE,  Notified Christianne BorrowShelia Bischof, [facility]  Admission status: Observation : Based on patients clinical presentation and evaluation of above clinical data, I have made determination that patient meets Observation criteria at this time.  Time spent in minutes : 70   Pearson GrippeJames Quintus Premo M.D on 01/09/2019 at 10:06 PM

## 2019-01-09 NOTE — Consult Note (Signed)
Cardiology Consultation   Patient ID: Cesar Morales; [id number]; 04-08-1961   Admit date: 01/09/2019 Date of Consult: 01/09/2019  Referring Provider:  Pearson Grippe, MD  Primary Care Provider: Junie Spencer, FNP Cardiologist: Cesar Morales Electrophysiologist:  NA  Reason for Consultation: CP  History of Present Illness: Cesar Morales is a 58 y.o. male with h/o CAD on cath in 2018 (as below), HTN, dyslipidemia, DM2 who is being seen today for the evaluation of CP at the request of hospital med svc. Pt's CP described as a "pressure" in the substernal area w/ radiation to his back b/w his shoulder blades. Some associated SOB. No relief w/ NTG. The pain began yesterday while pt was bushhogging, persisted through the night and was still present this morning. Pt went to the ED for further w/u. EKG negative, trops negative, CTA neg for PE. Pt's pain was persistent, so he was transferred here for further evaluation. Pt had LHD in 2018 showing ~80% OM lesion and o/w non-obstructive CAD. His CP at that time was felt to likely be non-cardiac.  Past Medical History:  Diagnosis Date  . COPD (chronic obstructive pulmonary disease) (HCC)   . Coronary artery disease   . Diabetes mellitus without complication (HCC)   . Dyslipidemia   . HTN (hypertension)     Past Surgical History:  Procedure Laterality Date  . CHOLECYSTECTOMY    . HERNIA REPAIR    . LEFT HEART CATH AND CORONARY ANGIOGRAPHY N/A 02/24/2017   Procedure: LEFT HEART CATH AND CORONARY ANGIOGRAPHY;  Surgeon: Cesar Lex, MD;  Location: [facility] INVASIVE CV LAB;  Service: Cardiovascular;  Laterality: N/A;      Current Medications: . [START ON 01/10/2019] aspirin EC  81 mg Oral Daily  . [START ON 01/10/2019] carvedilol  6.25 mg Oral BID WC  . [START ON 01/10/2019] citalopram  40 mg Oral Daily  . [START ON 01/10/2019] gabapentin  600 mg Oral TID PC & HS  . [START ON 01/10/2019] hydrochlorothiazide  25 mg Oral Daily  . [START ON 01/10/2019] insulin  aspart  0-9 Units Subcutaneous Q4H  . [START ON 01/10/2019] pantoprazole  40 mg Oral Daily  . simvastatin  20 mg Oral Once   No current facility-administered medications on file prior to encounter.    Current Outpatient Medications on File Prior to Encounter  Medication Sig Dispense Refill  . albuterol (PROVENTIL HFA;VENTOLIN HFA) 108 (90 Base) MCG/ACT inhaler Inhale 2 puffs into the lungs every 6 (six) hours as needed for wheezing or shortness of breath. 1 Inhaler 2  . aspirin EC 81 MG tablet Take 81 mg by mouth daily.    . bisacodyl (DULCOLAX) 5 MG EC tablet Take 1 tablet (5 mg total) by mouth 2 (two) times daily. 14 tablet 0  . citalopram (CELEXA) 40 MG tablet Take 1 tablet (40 mg total) by mouth daily. 30 tablet 5  . dexlansoprazole (DEXILANT) 60 MG capsule Take 1 capsule (60 mg total) by mouth daily. 90 capsule 1  . gabapentin (NEURONTIN) 600 MG tablet Take 1 tablet (600 mg total) by mouth 4 (four) times daily - after meals and at bedtime. 120 tablet 1  . Golimumab (SIMPONI) 50 MG/0.5ML SOAJ Inject into the skin.    . hydrochlorothiazide (HYDRODIURIL) 25 MG tablet Take 1 tablet (25 mg total) by mouth daily. 90 tablet 3  . isosorbide mononitrate (IMDUR) 120 MG 24 hr tablet TAKE 1 TABLET DAILY 30 tablet 0  . metFORMIN (GLUCOPHAGE) 1000 MG tablet Take 1 tablet (1,000 mg  total) by mouth 2 (two) times daily with a meal. 180 tablet 3  . naproxen (NAPROSYN) 500 MG tablet Take 1 tablet (500 mg total) by mouth 2 (two) times daily with a meal. 60 tablet 1  . nitroGLYCERIN (NITROSTAT) 0.4 MG SL tablet Place 1 tablet (0.4 mg total) under the tongue every 5 (five) minutes as needed for chest pain. 25 tablet 1  . traZODone (DESYREL) 50 MG tablet Take 1-2 tablets (50-100 mg total) by mouth at bedtime as needed for sleep. 60 tablet 3     Allergies:    Allergies  Allergen Reactions  . Lisinopril Swelling    angioedema  . Lipitor [Atorvastatin]     Stomach cramps    Social History:   The patient   reports that he has been smoking. He has been smoking about 0.50 packs per day. He has never used smokeless tobacco. He reports that he does not drink alcohol or use drugs.    Family History:   The patient's family history includes Cancer in his father; Heart disease in his father; Heart disease (age of onset: 63) in his mother.   ROS:  Please see the history of present illness.  All other ROS reviewed and negative.     Vital Signs: Blood pressure (!) 151/82, pulse 61, temperature 98.2 F (36.8 C), temperature source Oral, resp. rate 18, height 6' (1.829 m), weight 118.4 kg, SpO2 92 %.   PHYSICAL EXAM: General:  Well nourished, well developed, in no acute distress HEENT: normal Lymph: no adenopathy Neck: no JVD Endocrine:  No thryomegaly Vascular: No carotid bruits; DP pulses 2+ bilaterally   Cardiac:  normal S1, S2; RRR; no murmur  Lungs:  clear to auscultation bilaterally, no wheezing, rhonchi or rales  Abd: soft, nontender, no hepatomegaly  Ext: no edema Musculoskeletal:  No deformities, BUE and BLE strength normal and equal Skin: warm and dry  Neuro:  CNs 2-12 intact, no focal abnormalities noted Psych:  Normal affect   EKG:  EKG at [facility] still pending; EKG @[facility]   normal sinus rhythm at 70, borderline left axis deviation, normal intervals, no ST T-segment changes consistent with acute ischemia   Labs: @[facility]  Troponin I less than 0.01- less than 0.01 BNP 214.7, normal INR 0.9, d-dimer 0.75 WBC 10.3, hemoglobin 15.6 platelet count 243  No results for input(s): CKTOTAL, CKMB, TROPONINI in the last 72 hours. No results for input(s): TROPIPOC in the last 72 hours.  Lab Results  Component Value Date   WBC 9.2 11/25/2018   HGB 16.5 11/25/2018   HCT 48.6 11/25/2018   MCV 87 11/25/2018   PLT 276 11/25/2018   No results for input(s): NA, K, CL, CO2, BUN, CREATININE, CALCIUM, PROT, BILITOT, ALKPHOS, ALT, AST, GLUCOSE in the last 168 hours.  Invalid input(s): LABALBU Lab  Results  Component Value Date   CHOL 172 03-26-2017   HDL 28 (L) 2017/03/26   LDLCALC 97 03-26-2017   TRIG 236 (H) March 26, 2017   Lab Results  Component Value Date   DDIMER 0.61 (H) 02/23/2017    Radiology/Studies:  No results found.  LHC 03-26-17  Ost 1st Mrg to 1st Mrg lesion, 85 %stenosed.  Ost RPDA to RPDA lesion, 35 %stenosed.  Mid LAD-1 lesion, 20 %stenosed. Mid LAD-2 lesion, 35 %stenosed.  Ost 2nd Mrg lesion, 40 %stenosed.  There is mild left ventricular systolic dysfunction.  The left ventricular ejection fraction is 45-50% by visual estimate. Cannot exclude mild anterior hypokinesis - suggest 2 D Echo. The  patient does not have small arteries. He has large patulous vessels, but what may be causing symptoms is the small caliber OM1 branch which does appear to have a severe lesion proximally. Unfortunately this is an ostial lesion all branches that therefore not a good PCI target.  This lesion is also not likely causing and had continuous 4-5/10 chest pain at rest. Would consider nonanginal etiology.   TTE 08-10-15 Left ventricle: The cavity size was normal. Wall thickness was   normal. Systolic function was normal. The estimated ejection   fraction was in the range of 60% to 65%. Wall motion was normal;   there were no regional wall motion abnormalities. Left   ventricular diastolic function parameters were normal. - Aortic valve: Mildly calcified annulus. Trileaflet; normal   thickness leaflets. Valve area (VTI): 3.78 cm^2. Valve area   (Vmax): 3.07 cm^2. - Atrial septum: No defect or patent foramen ovale was identified. - Technically adequate study.  ASSESSMENT AND PLAN:  1. CP: somewhat atypical. No EKG changes, trops have been negative. NTG does not bring relief, although morphine seems to improve the pain. Pt has known CAD as above, with borderline OM lesion. Could consider nuc stress vs LHC based on what happens throughout the night tonight. No need for  heparin at this time as pt has already ruled out for ACS. Recommend continue cycling enzymes overnight, keep pt NPO.  Microvascular dz or vasospasm could be possible; pt has already been on imdur. Would cont at current dose for now.  2. HTN/dyslipidemia/DM2: restart home regimen and adjust PRN. Will defer to primary medical team.  Thank you for the opportunity to participate in the care of this patient.  For questions or updates, please contact [facility] Please consult [email or web address] for contact info under    Signed, Precious Reel, MD, [facility]  01/09/2019 11:31 PM

## 2019-01-09 NOTE — Progress Notes (Signed)
Patient arrived to unit with [facility].  Chest pain of 5/10 described as pressure radiating to back between shoulder blades as a sharp pain. Patient placed on telemetry, Admits paged for assigned provider.  VSS.

## 2019-01-10 ENCOUNTER — Other Ambulatory Visit: Payer: Self-pay

## 2019-01-10 ENCOUNTER — Encounter (HOSPITAL_COMMUNITY): Payer: Self-pay

## 2019-01-10 ENCOUNTER — Observation Stay (HOSPITAL_BASED_OUTPATIENT_CLINIC_OR_DEPARTMENT_OTHER): Payer: Self-pay

## 2019-01-10 DIAGNOSIS — Z720 Tobacco use: Secondary | ICD-10-CM | POA: Diagnosis present

## 2019-01-10 DIAGNOSIS — R079 Chest pain, unspecified: Secondary | ICD-10-CM | POA: Diagnosis present

## 2019-01-10 DIAGNOSIS — I200 Unstable angina: Secondary | ICD-10-CM

## 2019-01-10 DIAGNOSIS — I351 Nonrheumatic aortic (valve) insufficiency: Secondary | ICD-10-CM

## 2019-01-10 LAB — CBC
HCT: 43.9 % (ref 39.0–52.0)
Hemoglobin: 14.4 g/dL (ref 13.0–17.0)
MCH: 29.9 pg (ref 26.0–34.0)
MCHC: 32.8 g/dL (ref 30.0–36.0)
MCV: 91.1 fL (ref 80.0–100.0)
Platelets: 217 10*3/uL (ref 150–400)
RBC: 4.82 MIL/uL (ref 4.22–5.81)
RDW: 14.1 % (ref 11.5–15.5)
WBC: 10.8 10*3/uL — ABNORMAL HIGH (ref 4.0–10.5)
nRBC: 0 % (ref 0.0–0.2)

## 2019-01-10 LAB — GLUCOSE, CAPILLARY
Glucose-Capillary: 104 mg/dL — ABNORMAL HIGH (ref 70–99)
Glucose-Capillary: 117 mg/dL — ABNORMAL HIGH (ref 70–99)
Glucose-Capillary: 119 mg/dL — ABNORMAL HIGH (ref 70–99)
Glucose-Capillary: 129 mg/dL — ABNORMAL HIGH (ref 70–99)
Glucose-Capillary: 152 mg/dL — ABNORMAL HIGH (ref 70–99)
Glucose-Capillary: 194 mg/dL — ABNORMAL HIGH (ref 70–99)

## 2019-01-10 LAB — LIPID PANEL
Cholesterol: 198 mg/dL (ref 0–200)
HDL: 34 mg/dL — ABNORMAL LOW (ref >40)
LDL Cholesterol: 127 mg/dL — ABNORMAL HIGH (ref 0–99)
Total CHOL/HDL Ratio: 5.8 RATIO
Triglycerides: 185 mg/dL — ABNORMAL HIGH (ref <150)
VLDL: 37 mg/dL (ref 0–40)

## 2019-01-10 LAB — COMPREHENSIVE METABOLIC PANEL
ALT: 19 U/L (ref 0–44)
AST: 20 U/L (ref 15–41)
Albumin: 3.5 g/dL (ref 3.5–5.0)
Alkaline Phosphatase: 63 U/L (ref 38–126)
Anion gap: 10 (ref 5–15)
BUN: 14 mg/dL (ref 6–20)
CO2: 23 mmol/L (ref 22–32)
Calcium: 8.7 mg/dL — ABNORMAL LOW (ref 8.9–10.3)
Chloride: 103 mmol/L (ref 98–111)
Creatinine, Ser: 0.89 mg/dL (ref 0.61–1.24)
GFR calc Af Amer: >60 mL/min (ref >60)
GFR calc non Af Amer: >60 mL/min (ref >60)
Glucose, Bld: 114 mg/dL — ABNORMAL HIGH (ref 70–99)
Potassium: 4.3 mmol/L (ref 3.5–5.1)
Sodium: 136 mmol/L (ref 135–145)
Total Bilirubin: 1 mg/dL (ref 0.3–1.2)
Total Protein: 6.4 g/dL — ABNORMAL LOW (ref 6.5–8.1)

## 2019-01-10 LAB — ECHOCARDIOGRAM COMPLETE
Height: 72 in
Weight: 4161.6 oz

## 2019-01-10 LAB — TROPONIN I (HIGH SENSITIVITY): Troponin I (High Sensitivity): 7 ng/L (ref <18)

## 2019-01-10 LAB — HIV ANTIBODY (ROUTINE TESTING W REFLEX): HIV Screen 4th Generation wRfx: NONREACTIVE

## 2019-01-10 LAB — HEMOGLOBIN A1C
Hgb A1c MFr Bld: 6.1 % — ABNORMAL HIGH (ref 4.8–5.6)
Mean Plasma Glucose: 128.37 mg/dL

## 2019-01-10 MED ORDER — INSULIN ASPART 100 UNIT/ML ~~LOC~~ SOLN
0.0000 [IU] | Freq: Three times a day (TID) | SUBCUTANEOUS | Status: DC
  Administered 2019-01-10: 2 [IU] via SUBCUTANEOUS
  Administered 2019-01-11: 1 [IU] via SUBCUTANEOUS

## 2019-01-10 MED ORDER — ONDANSETRON HCL 4 MG/2ML IJ SOLN
4.0000 mg | Freq: Four times a day (QID) | INTRAMUSCULAR | Status: DC | PRN
  Administered 2019-01-10: 4 mg via INTRAVENOUS
  Filled 2019-01-10 (×2): qty 2

## 2019-01-10 MED ORDER — ALPRAZOLAM 0.25 MG PO TABS
0.2500 mg | ORAL_TABLET | Freq: Two times a day (BID) | ORAL | Status: DC | PRN
  Administered 2019-01-10: 0.25 mg via ORAL
  Filled 2019-01-10 (×2): qty 1

## 2019-01-10 NOTE — Progress Notes (Signed)
Pt's Covid test is not yet complete so cannot have cardiac CTA until tomorrow when test returned.  Pt has diet.

## 2019-01-10 NOTE — Progress Notes (Addendum)
Patient having side effects of nausea, headache, and sweats from nitro drip.  Zofran given and then morphine.  RN paged cardiology to notify.

## 2019-01-10 NOTE — Progress Notes (Signed)
  Echocardiogram 2D Echocardiogram has been performed.  Cesar Morales 01/10/2019, 12:56 PM

## 2019-01-10 NOTE — Progress Notes (Signed)
[facility] PROGRESS NOTE  Cesar Morales ZOX:[id number] DOB: 04-02-1961 DOA: 01/09/2019 PCP: Sharion Balloon, FNP  Assessment/Plan:  Chest pain. Heart score 5. Hx CAD. Continues with chest "pressure" this am. States it subsided during night and returned this am. Trop negative, no events on tele.  Chest CT negative for PR. Evaluated by cardiology who opine that given hx CAD and risk factors recommend CT-A. This cannot be done until tomorrow.  -follow cardiac echo -feed now and resume N.p.o. after midnight Continue aspirin Trial of Simvastatin 20mg  po x1 since allerghic to Lipitor -continue Carvedilol 6.25mg  po bid -continue Nitro gtt., morphine sulfate 1mg  iv q4h prn     Dm2. Home meds include metformin. Serum glucose 114.  -continue to hold  Metformin in case needs cardiac catheterization -SSI  Diabetic neuropathy Cont Gabapentin  Hypertension only fair control. Home meds include hygroton  -Cont hydrochlorothiaizide 25mg  po qday -continue coreg per cards  Jerrye Bushy stable at baseline Cont PPI Consider GI consultation if cardiac evaluation negative  RA home meds include methotrexate, prednisonlone. Of note, methotrexate taken weekly DC Naproxen for now DC SImponi for now  Copd  Cont Albuterol prn  Anxiety appears somewhat anxious particularly in setting of continued pain and delay in cardiac work up -Cont Celexa 40mg  po qday -low dose prn xanax.  -monitor  Insomnia -xanax   Pulmonary nodule in smoker Notified patient that radiology recommended f/u CT chest in 12 months.  Also notified his wife of this finding -cessation counseling offered   Code Status: full Family Communication: patient Disposition Plan: home when ready hopefully tomorrow   Consultants:  Skeet Latch cardiology  Procedures:  echo  Antibiotics:    HPI/Subjective: Awake alert slightly anxious. Reports chest "pressure" at 2/10 this am.   Objective: Vitals:   01/10/19  0907 01/10/19 1209  BP: (!) 154/86 (!) 163/85  Pulse: (!) 58 (!) 58  Resp:    Temp:    SpO2:  97%    Intake/Output Summary (Last 24 hours) at 01/10/2019 1256 Last data filed at 01/10/2019 1019 Gross per 24 hour  Intake 1042.91 ml  Output 400 ml  Net 642.91 ml   Filed Weights   01/09/19 2125 01/10/19 0457  Weight: 118.4 kg 118 kg    Exam:   General:  Awake alert no acute distress  Cardiovascular: rrr no MGR no LE edema  Respiratory: normal effort BS slightly coarse, no wheeze  Abdomen: obese soft +BS non-tender to palpation  Musculoskeletal: joints without swelling/erythema   Data Reviewed: Basic Metabolic Panel: Recent Labs  Lab 01/10/19 0222  NA 136  K 4.3  CL 103  CO2 23  GLUCOSE 114*  BUN 14  CREATININE 0.89  CALCIUM 8.7*   Liver Function Tests: Recent Labs  Lab 01/10/19 0222  AST 20  ALT 19  ALKPHOS 63  BILITOT 1.0  PROT 6.4*  ALBUMIN 3.5   No results for input(s): LIPASE, AMYLASE in the last 168 hours. No results for input(s): AMMONIA in the last 168 hours. CBC: Recent Labs  Lab 01/10/19 0222  WBC 10.8*  HGB 14.4  HCT 43.9  MCV 91.1  PLT 217   Cardiac Enzymes: No results for input(s): CKTOTAL, CKMB, CKMBINDEX, TROPONINI in the last 168 hours. BNP (last 3 results) No results for input(s): BNP in the last 8760 hours.  ProBNP (last 3 results) No results for input(s): PROBNP in the last 8760 hours.  CBG: Recent Labs  Lab 01/10/19 0017 01/10/19 0502 01/10/19 0805 01/10/19 1225  GLUCAP 129* 104* 117* 119*    No results found for this or any previous visit (from the past 240 hour(s)).   Studies: No results found.  Scheduled Meds: . aspirin EC  81 mg Oral Daily  . carvedilol  6.25 mg Oral BID WC  . citalopram  40 mg Oral Daily  . gabapentin  600 mg Oral TID PC & HS  . hydrochlorothiazide  25 mg Oral Daily  . insulin aspart  0-9 Units Subcutaneous Q4H  . pantoprazole  40 mg Oral Daily   Continuous Infusions: .  nitroGLYCERIN 10 mcg/min (01/10/19 0003)    Principal Problem:   Chest pain Active Problems:   Coronary artery disease   Chronic obstructive pulmonary disease (HCC)   Essential hypertension   Non-insulin treated type 2 diabetes mellitus (HCC)   Diabetic peripheral neuropathy (HCC)   Pulmonary nodule   Dyslipidemia   Tobacco abuse    Time spent: 45 min    [facility] M  [facility]  If 7PM-7AM, please contact night-coverage at [email or web address], password [facility] 01/10/2019, 12:56 PM  LOS: 1 day

## 2019-01-10 NOTE — Progress Notes (Signed)
Progress Note  Patient Name: Cesar Morales Date of Encounter: 01/10/2019  Primary Cardiologist: Minus Breeding, MD   Subjective   My pain is present but improved with NTG drip.    Inpatient Medications    Scheduled Meds: . aspirin EC  81 mg Oral Daily  . carvedilol  6.25 mg Oral BID WC  . citalopram  40 mg Oral Daily  . gabapentin  600 mg Oral TID PC & HS  . hydrochlorothiazide  25 mg Oral Daily  . insulin aspart  0-9 Units Subcutaneous Q4H  . pantoprazole  40 mg Oral Daily   Continuous Infusions: . nitroGLYCERIN 10 mcg/min (01/10/19 0003)   PRN Meds: acetaminophen **OR** acetaminophen, albuterol, morphine injection, ondansetron (ZOFRAN) IV, traZODone   Vital Signs    Vitals:   01/10/19 0000 01/10/19 0457 01/10/19 0756 01/10/19 0907  BP: (!) 144/88 (!) 149/65 (!) 149/89 (!) 154/86  Pulse:  (!) 58 (!) 56 (!) 58  Resp:  18    Temp:  97.7 F (36.5 C) 97.8 F (36.6 C)   TempSrc:  Oral Oral   SpO2:  97% 97%   Weight:  118 kg    Height:        Intake/Output Summary (Last 24 hours) at 01/10/2019 0950 Last data filed at 01/10/2019 0930 Gross per 24 hour  Intake 1042.91 ml  Output -  Net 1042.91 ml   Last 3 Weights 01/10/2019 01/09/2019 11/25/2018  Weight (lbs) 260 lb 1.6 oz 261 lb 1.6 oz 264 lb  Weight (kg) 117.981 kg 118.434 kg 119.75 kg      Telemetry    SR - Personally Reviewed  ECG    No new - Personally Reviewed  Physical Exam   GEN: No acute distress.   Neck: No JVD Cardiac: RRR, no murmurs, rubs, or gallops.  Respiratory: Clear to auscultation bilaterally. GI: Soft, nontender, non-distended  MS: No edema; No deformity. Neuro:  Nonfocal  Psych: Normal affect   Labs    High Sensitivity Troponin:   Recent Labs  Lab 01/09/19 2244 01/10/19 0222  TROPONINIHS 7 7      Cardiac EnzymesNo results for input(s): TROPONINI in the last 168 hours. No results for input(s): TROPIPOC in the last 168 hours.   Chemistry Recent Labs  Lab 01/10/19 0222   NA 136  K 4.3  CL 103  CO2 23  GLUCOSE 114*  BUN 14  CREATININE 0.89  CALCIUM 8.7*  PROT 6.4*  ALBUMIN 3.5  AST 20  ALT 19  ALKPHOS 63  BILITOT 1.0  GFRNONAA >60  GFRAA >60  ANIONGAP 10     Hematology Recent Labs  Lab 01/10/19 0222  WBC 10.8*  RBC 4.82  HGB 14.4  HCT 43.9  MCV 91.1  MCH 29.9  MCHC 32.8  RDW 14.1  PLT 217    BNPNo results for input(s): BNP, PROBNP in the last 168 hours.   DDimer No results for input(s): DDIMER in the last 168 hours.   Radiology    No results found.  Cardiac Studies   Prior cath see below   Patient Profile     58 y.o. male with CAD and last cath 2018 with nonobstructive disease except for He has large patulous vessels, but what may be causing symptoms is the small caliber OM1 branch which does appear to have a severe lesion proximally. Unfortunately this is an ostial lesion all branches that therefore not a good PCI target, and with severe pain pt was having it  was not felt the cause of his chest pain, also HTN, dyslipidemia, DM2, and admitted-transferred from [place] with chest pain.    Assessment & Plan    Chest Pain with all troponins neg, here at [facility] HS troponin 7 NTG improved chest pain, plan for cardiac CTA today   CAD mostly non obstructive on cath 2018 and 85% OM stenosis not good PCI target.   HLD poorly controlled for diabetic with known CAD LDL of 127 -stomach cramps on lipitor. Now on zocor 20 mg.   HTN  On coreg HCTZ bp 149/65    DM -2 per primary team.          For questions or updates, please contact [facility] Please consult [email or web address] for contact info under        Signed, Nada Boozer, NP  01/10/2019, 9:50 AM

## 2019-01-10 NOTE — Progress Notes (Signed)
Spoke with NP regarding Covid test.  Will pass on to night shift instructions to wait until 5am for the Covid results, if results are not back at 5am nurse is to place ([facility]) covid test (Sars-2) for Cardiac CTA.

## 2019-01-11 ENCOUNTER — Inpatient Hospital Stay (HOSPITAL_COMMUNITY): Payer: Self-pay

## 2019-01-11 DIAGNOSIS — I700 Atherosclerosis of aorta: Secondary | ICD-10-CM

## 2019-01-11 LAB — GLUCOSE, CAPILLARY
Glucose-Capillary: 111 mg/dL — ABNORMAL HIGH (ref 70–99)
Glucose-Capillary: 137 mg/dL — ABNORMAL HIGH (ref 70–99)

## 2019-01-11 LAB — NOVEL CORONAVIRUS, NAA (HOSP ORDER, SEND-OUT TO REF LAB; TAT 18-24 HRS): SARS-CoV-2, NAA: NOT DETECTED

## 2019-01-11 LAB — SARS CORONAVIRUS 2 (TAT 6-24 HRS): SARS Coronavirus 2: NEGATIVE

## 2019-01-11 MED ORDER — EZETIMIBE 10 MG PO TABS: 10.0000 mg | ORAL_TABLET | Freq: Every day | ORAL | 1 refills | Status: DC

## 2019-01-11 MED ORDER — IOHEXOL 350 MG/ML SOLN
100.0000 mL | Freq: Once | INTRAVENOUS | Status: AC | PRN
  Administered 2019-01-11: 100 mL via INTRAVENOUS

## 2019-01-11 MED ORDER — AMLODIPINE BESYLATE 5 MG PO TABS: 5.0000 mg | ORAL_TABLET | Freq: Every day | ORAL | 1 refills | Status: DC

## 2019-01-11 MED ORDER — AMLODIPINE BESYLATE 5 MG PO TABS
5.0000 mg | ORAL_TABLET | Freq: Every day | ORAL | Status: DC
  Administered 2019-01-11: 12:00:00 5 mg via ORAL
  Filled 2019-01-11: qty 1

## 2019-01-11 MED ORDER — EZETIMIBE 10 MG PO TABS
10.0000 mg | ORAL_TABLET | Freq: Every day | ORAL | Status: DC
  Administered 2019-01-11: 10 mg via ORAL
  Filled 2019-01-11: qty 1

## 2019-01-11 MED ORDER — PANTOPRAZOLE SODIUM 40 MG PO TBEC: 40.0000 mg | DELAYED_RELEASE_TABLET | Freq: Every day | ORAL | 1 refills | Status: DC

## 2019-01-11 MED ORDER — CARVEDILOL 6.25 MG PO TABS: 6.2500 mg | ORAL_TABLET | Freq: Two times a day (BID) | ORAL | 1 refills | Status: DC

## 2019-01-11 MED FILL — CARVEDILOL 6.25 MG TABLET: 6.25 | 30 days supply | Qty: 60 | Fill #0

## 2019-01-11 MED FILL — PANTOPRAZOLE SOD DR 40 MG T: 40 | 30 days supply | Qty: 30 | Fill #0

## 2019-01-11 MED FILL — EZETIMIBE 10 MG TABS: 10 | 30 days supply | Qty: 30 | Fill #0

## 2019-01-11 MED FILL — AMLODIPINE BESYLATE 5 MG TA: 5 | 30 days supply | Qty: 30 | Fill #0

## 2019-01-11 NOTE — Progress Notes (Signed)
Patient taken out in wheelchair to car is discharged home. Wife came to pick him up. His PIV was removed with catheter intact before discharge home.

## 2019-01-11 NOTE — Progress Notes (Signed)
Could not put appt in AVS  He is to see Dr. Purcell Nails with Dr. Percival Spanish 02/02/19 at [facility]

## 2019-01-11 NOTE — TOC Transition Note (Addendum)
Transition of Care [facility]) - CM/SW Discharge Note   Patient Details  Name: Cesar Morales MRN: [id number] Date of Birth: 1961-03-18  Transition of [facility]) CM/SW Contact:  Zenon Mayo, RN Phone Number: 01/11/2019, 8:49 AM   Clinical Narrative:    Patient is from home with wife, pta indep, he has a PCP and would like to continue going to her.  He states he needs ast with Medications at dc, his wife will have funds when she pick him up today.  NCM will assist with Match letter if needed.  Per [facility] pharmacy, total for meds is 19.00 , patient states his wife has his debit card and she is here but stepped out for a moment.  NCM informed Conservation officer, nature of this information to call [facility] pharmacy when wife comes back.   Final next level of care: Home/Self Care Barriers to Discharge: No Barriers Identified   Patient Goals and CMS Choice Patient states their goals for this hospitalization and ongoing recovery are:: to not have pain   Choice offered to / list presented to : NA  Discharge Placement                       Discharge Plan and Services                DME Arranged: (NA)         HH Arranged: NA          Social Determinants of Health (SDOH) Interventions     Readmission Risk Interventions No flowsheet data found.

## 2019-01-11 NOTE — Plan of Care (Signed)
  Problem: Education: Goal: Knowledge of General Education information will improve Description: Including pain rating scale, medication(s)/side effects and non-pharmacologic comfort measures Outcome: Adequate for Discharge   Problem: Health Behavior/Discharge Planning: Goal: Ability to manage health-related needs will improve Outcome: Adequate for Discharge   Problem: Clinical Measurements: Goal: Ability to maintain clinical measurements within normal limits will improve Outcome: Adequate for Discharge Goal: Diagnostic test results will improve Outcome: Adequate for Discharge Goal: Respiratory complications will improve Outcome: Adequate for Discharge Goal: Cardiovascular complication will be avoided Outcome: Adequate for Discharge   Problem: Activity: Goal: Risk for activity intolerance will decrease Outcome: Adequate for Discharge   Problem: Nutrition: Goal: Adequate nutrition will be maintained Outcome: Adequate for Discharge   Problem: Coping: Goal: Level of anxiety will decrease Outcome: Adequate for Discharge   Problem: Elimination: Goal: Will not experience complications related to bowel motility Outcome: Adequate for Discharge Goal: Will not experience complications related to urinary retention Outcome: Adequate for Discharge   Problem: Pain Managment: Goal: General experience of comfort will improve Outcome: Adequate for Discharge   Problem: Safety: Goal: Ability to remain free from injury will improve Outcome: Adequate for Discharge

## 2019-01-11 NOTE — Discharge Summary (Signed)
Physician Discharge Summary  Jadarian Mckay SCB:[id number] DOB: 11/24/1960 DOA: 01/09/2019  PCP: Junie Spencer, FNP  Admit date: 01/09/2019 Discharge date: 01/11/2019  Time spent: 45 minutes  Recommendations for Outpatient Follow-up:  1. Take medications as prescribed 2. Cardiology will contact you for follow up 3. Follow up with PCP 1-2 weeks for evaluation of symptoms and BP control   Discharge Diagnoses:  Principal Problem:   Chest pain Active Problems:   Coronary artery disease   Chronic obstructive pulmonary disease (HCC)   Essential hypertension   Non-insulin treated type 2 diabetes mellitus (HCC)   Diabetic peripheral neuropathy (HCC)   Pulmonary nodule   Dyslipidemia   Tobacco abuse   Chest pain at rest   Discharge Condition: stable  Diet recommendation: carb modified  Filed Weights   01/09/19 2125 01/10/19 0457 01/11/19 0213  Weight: 118.4 kg 118 kg 117.6 kg    History of present illness:   Devine Klingel  is a 58 y.o. male,  w hypertension, hyperlipidemia, Dm2, w neuropathy, CAD, Copd not on home o2, apparently presented 01/09/19 with c/o chest pain  " substernal  chest pressure" w radiation to the back starting the day prior afternoon while bushhogging.  Associated with sob.  Pt took slg nitro w minimal relief.  He thought it would go away, but continued to have chest pain, and woke up w pain in the AM.  Due to the pain not improving with more slg nitro patient presented to ER at [facility] Course:   Chest pain. Heart score 5. Hx CAD. Continued with intermittent chest "pressure". Trop negative, no events on tele.  Chest CT negative for PE. Evaluated by cardiology who opine that given hx CAD and risk factors recommend CT-A. Discussed results with dr Duke Salvia who opined patient with known OM disease imaging with no new abnormalities and does not think discomfort has cardiac etiology.  Recommend aggressive lifestyle changes and PPI.  Dm2. Home meds  include metformin. Serum glucose 114. I  Diabetic neuropathy Cont Gabapentin  Hypertension poor control. Home meds include hygroton  Coreg and amlodipine added.   Gerd stable at baseline Cont PPI Consider GI consultation if cardiac evaluation negative  RA home meds include methotrexate, prednisonlone. Of note, methotrexate taken weekly DC Naproxen for now DC SImponi for now  Copd  Cont Albuterol prn  Anxiety appeared somewhat anxious particularly in setting of continued pain and delay in cardiac work up. -monitor  Insomnia -xanax  Pulmonary nodule in smoker Notified patient that radiology recommended f/u CT chest in 12 months. cessation counseling offered  Procedures: CT coronary  Consultations:  [facility] [place] [facility]  Discharge Exam: Vitals:   01/11/19 0943 01/11/19 1150  BP: (!) 156/88 (!) 163/92  Pulse: (!) 58 (!) 57  Resp: 18 20  Temp: 97.7 F (36.5 C) 97.8 F (36.6 C)  SpO2: 95% 96%    General: awake alert no acute  Cardiovascular: rrr no mgr no LE edema Respiratory: normal effort BS clear bilaterally no wheeze  Discharge Instructions   Discharge Instructions    Call MD for:  difficulty breathing, headache or visual disturbances   Complete by: As directed    Call MD for:  persistant dizziness or light-headedness   Complete by: As directed    Call MD for:  temperature >100.4   Complete by: As directed    Diet - low sodium heart healthy   Complete by: As directed    Discharge instructions  Complete by: As directed    Take medications as prescribed.  Cardiology will contact you for follow up   Increase activity slowly   Complete by: As directed      Allergies as of 01/11/2019      Reactions   Lisinopril Swelling   angioedema   Lipitor [atorvastatin]    Stomach cramps      Medication List    STOP taking these medications   dexlansoprazole 60 MG capsule Commonly known as: Dexilant Replaced by: pantoprazole 40 MG  tablet     TAKE these medications   albuterol 108 (90 Base) MCG/ACT inhaler Commonly known as: VENTOLIN HFA Inhale 2 puffs into the lungs every 6 (six) hours as needed for wheezing or shortness of breath.   amLODipine 5 MG tablet Commonly known as: NORVASC Take 1 tablet (5 mg total) by mouth daily. Start taking on: January 12, 2019   aspirin EC 81 MG tablet Take 81 mg by mouth daily.   bisacodyl 5 MG EC tablet Commonly known as: DULCOLAX Take 1 tablet (5 mg total) by mouth 2 (two) times daily.   carvedilol 6.25 MG tablet Commonly known as: COREG Take 1 tablet (6.25 mg total) by mouth 2 (two) times daily with a meal.   chlorthalidone 25 MG tablet Commonly known as: HYGROTON Take 25 mg by mouth daily.   citalopram 40 MG tablet Commonly known as: CeleXA Take 1 tablet (40 mg total) by mouth daily. What changed: how much to take   ezetimibe 10 MG tablet Commonly known as: ZETIA Take 1 tablet (10 mg total) by mouth daily. Start taking on: January 12, 8143   folic acid 1 MG tablet Commonly known as: FOLVITE Take 1 mg by mouth daily.   gabapentin 600 MG tablet Commonly known as: NEURONTIN Take 1 tablet (600 mg total) by mouth 4 (four) times daily - after meals and at bedtime.   isosorbide mononitrate 120 MG 24 hr tablet Commonly known as: IMDUR TAKE 1 TABLET DAILY   metFORMIN 1000 MG tablet Commonly known as: GLUCOPHAGE Take 1 tablet (1,000 mg total) by mouth 2 (two) times daily with a meal.   methotrexate 2.5 MG tablet Commonly known as: RHEUMATREX Take 15 mg by mouth once a week. Caution:Chemotherapy. Protect from light. Take on Sunday   naproxen 500 MG tablet Commonly known as: Naprosyn Take 1 tablet (500 mg total) by mouth 2 (two) times daily with a meal.   nitroGLYCERIN 0.4 MG SL tablet Commonly known as: NITROSTAT Place 1 tablet (0.4 mg total) under the tongue every 5 (five) minutes as needed for chest pain.   pantoprazole 40 MG tablet Commonly known  as: PROTONIX Take 1 tablet (40 mg total) by mouth daily. Start taking on: January 12, 2019 Replaces: dexlansoprazole 60 MG capsule   prednisoLONE 5 MG Tabs tablet Take 5 mg by mouth daily. Also take  An additional 5 mg as needed   Simponi 50 MG/0.5ML Soaj Generic drug: Golimumab Inject 50 mg into the skin every 30 (thirty) days.   traZODone 50 MG tablet Commonly known as: DESYREL Take 1-2 tablets (50-100 mg total) by mouth at bedtime as needed for sleep.      Allergies  Allergen Reactions  . Lisinopril Swelling    angioedema  . Lipitor [Atorvastatin]     Stomach cramps   Follow-up Information    Sharion Balloon, FNP.   Specialty: Family Medicine Contact information: [place] [address] [phone]  The results of significant diagnostics from this hospitalization (including imaging, microbiology, ancillary and laboratory) are listed below for reference.    Significant Diagnostic Studies: Ct Coronary Morph W/cta Cor W/score W/ca W/cm &/or Wo/cm  Addendum Date: 01/11/2019   ADDENDUM REPORT: 01/11/2019 14:28 CLINICAL DATA:  62M with CAD, hyperlipidemia, and hypertension admitted with chest pain. EXAM: Cardiac/Coronary  CT TECHNIQUE: The patient was scanned on a [employer]. FINDINGS: A 120 kV prospective scan was triggered in the descending thoracic aorta at 111 HU's. Axial non-contrast 3 mm slices were carried out through the heart. The data set was analyzed on a dedicated work station and scored using the Agatson method. Gantry rotation speed was 250 msecs and collimation was .6 mm. No beta blockade and 0.8 mg of sl NTG was given. The 3D data set was reconstructed in 5% intervals of the 67-82 % of the R-R cycle. Diastolic phases were analyzed on a dedicated work station using MPR, MIP and VRT modes. The patient received 80 cc of contrast. Aorta: Mild ascending aorta aneurysm. 3.7cm. Minimal calcification. No dissection. Aortic  Valve:  Trileaflet.  No calcifications. Coronary Arteries:  Normal coronary origin.  Right dominance. RCA is a large dominant artery that gives rise to PDA and PLVB. There is minimal, calcified plaque in the proximal RCA. Left main is a large artery that gives rise to LAD and LCX arteries. LAD is a large vessel. There is mild plaque in the proximal to mid LAD. D1 is large with minimal soft attenuation plaque in the ostium. LCX is a non-dominant artery that gives rise to a small OM1 branch with severe calcified plaque in the ostium. This is a small vessel not amenable to PCI. There is mild (25-49%) there is minimal (<25%) calcified plaque in the mid LCx and minimal soft attenuation plaque in the distal LCX. Other findings: Normal pulmonary vein drainage into the left atrium. Normal let atrial appendage without a thrombus. Normal size of the pulmonary artery. IMPRESSION: 1. Coronary calcium score of 108. This was 74th percentile for age and sex matched control. 2. Normal coronary origin with right dominance. 3. There is obstructive disease in OM1 that is not amenable to PCI due to small vessel size. 4. Findings unchanged from cardiac catheterization in 2018. 5. Recommend aggressive risk factor modification including high potency statin and aspirin. Chilton Si, MD Electronically Signed   By: Chilton Si   On: 01/11/2019 14:28   Result Date: 01/11/2019 EXAM: OVER-READ INTERPRETATION  CT CHEST The following report is an over-read performed by radiologist Dr. Jeronimo Greaves of [facility] on 01/11/2019. This over-read does not include interpretation of cardiac or coronary anatomy or pathology. The coronary CTA interpretation by the cardiologist is attached. COMPARISON:  CT of the chest of 01/09/2019 from [facility]. FINDINGS: Vascular: Normal aortic caliber. Aortic atherosclerosis. No dissection. No central pulmonary embolism, on this non-dedicated study. Mediastinum/Nodes: No imaged thoracic  adenopathy. Lungs/Pleura: No pleural fluid. Right lower lobe dependent atelectasis. Previously described 3 mm right lower lobe pulmonary nodule is again identified on image 8/13. Upper Abdomen: Normal imaged portions of the liver, spleen, stomach. Musculoskeletal: Moderate bilateral gynecomastia. Midthoracic spondylosis. IMPRESSION: 1.  No acute findings in the imaged extracardiac chest. 2. Right lower lobe pulmonary nodule, as on recent CTA chest. No follow-up needed if patient is low-risk. Non-contrast chest CT can be considered in 12 months if patient is high-risk. This recommendation follows the consensus statement: Guidelines for Management of Incidental Pulmonary Nodules Detected  on CT Images: From the Fleischner Society 2017; Radiology 2017; 284:228-243. 3.  Aortic Atherosclerosis (ICD10-I70.0). 4. Bilateral gynecomastia. Electronically Signed: By: Jeronimo GreavesKyle  Talbot M.D. On: 01/11/2019 12:44    Microbiology: Recent Results (from the past 240 hour(s))  Novel Coronavirus, NAA (hospital order; send-out to ref lab)     Status: None   Collection Time: 01/10/19  1:26 AM  Result Value Ref Range Status   SARS-CoV-2, NAA NOT DETECTED NOT DETECTED Final    Comment: (NOTE) This test was developed and its performance characteristics determined by [facility]. This test has not been FDA cleared or approved. This test has been authorized by FDA under an Emergency Use Authorization (EUA). This test is only authorized for the duration of time the declaration that circumstances exist justifying the authorization of the emergency use of in vitro diagnostic tests for detection of SARS-CoV-2 virus and/or diagnosis of COVID-19 infection under section 564(b)(1) of the Act, 21 U.S.C. 161WRU-0(A)(5360bbb-3(b)(1), unless the authorization is terminated or revoked sooner. When diagnostic testing is negative, the possibility of a false negative result should be considered in the context of a patient's recent exposures and  the presence of clinical signs and symptoms consistent with COVID-19. An individual without symptoms of COVID-19 and who is not shedding SARS-CoV-2 virus would expect to have a negative (not detected) result in this assay. Performed  At: [facility] [place] [address] [place], [place] [id number] Jolene SchimkeNagendra Sanjai MD NW:[phone]    Coronavirus Source NASOPHARYNGEAL  Final    Comment: Performed at [facility], [address] [place], [place] [address]  SARS CORONAVIRUS 2 (TAT 6-12 HRS) Nasal Swab Aptima Multi Swab     Status: None   Collection Time: 01/11/19  5:00 AM   Specimen: Aptima Multi Swab; Nasal Swab  Result Value Ref Range Status   SARS Coronavirus 2 NEGATIVE NEGATIVE Final    Comment: (NOTE) SARS-CoV-2 target nucleic acids are NOT DETECTED. The SARS-CoV-2 RNA is generally detectable in upper and lower respiratory specimens during the acute phase of infection. Negative results do not preclude SARS-CoV-2 infection, do not rule out co-infections with other pathogens, and should not be used as the sole basis for treatment or other patient management decisions. Negative results must be combined with clinical observations, patient history, and epidemiological information. The expected result is Negative. Fact Sheet for Patients: HairSlick.nohttps://www.fda.gov/media/138098/download Fact Sheet for Healthcare Providers: quierodirigir.comhttps://www.fda.gov/media/138095/download This test is not yet approved or cleared by the [place] FDA and  has been authorized for detection and/or diagnosis of SARS-CoV-2 by FDA under an Emergency Use Authorization (EUA). This EUA will remain  in effect (meaning this test can be used) for the duration of the COVID-19 declaration under Section 56 4(b)(1) of the Act, 21 U.S.C. section 360bbb-3(b)(1), unless the authorization is terminated or revoked sooner. Performed at [facility], [address] [place], [place] [address]      Labs: Basic  Metabolic Panel: Recent Labs  Lab 01/10/19 0222  NA 136  K 4.3  CL 103  CO2 23  GLUCOSE 114*  BUN 14  CREATININE 0.89  CALCIUM 8.7*   Liver Function Tests: Recent Labs  Lab 01/10/19 0222  AST 20  ALT 19  ALKPHOS 63  BILITOT 1.0  PROT 6.4*  ALBUMIN 3.5   No results for input(s): LIPASE, AMYLASE in the last 168 hours. No results for input(s): AMMONIA in the last 168 hours. CBC: Recent Labs  Lab 01/10/19 0222  WBC 10.8*  HGB 14.4  HCT 43.9  MCV 91.1  PLT 217   Cardiac Enzymes: No results for input(s): CKTOTAL, CKMB, CKMBINDEX, TROPONINI in the last 168 hours. BNP: BNP (last 3 results) No results for input(s): BNP in the last 8760 hours.  ProBNP (last 3 results) No results for input(s): PROBNP in the last 8760 hours.  CBG: Recent Labs  Lab 01/10/19 1225 01/10/19 1619 01/10/19 1951 01/11/19 0517 01/11/19 1145  GLUCAP 119* 194* 152* 111* 137*       Signed:  Gwenyth BenderBLACK,Orey Moure M NP [facility] 01/11/2019, 2:46 PM

## 2019-01-11 NOTE — Progress Notes (Signed)
Progress Note  Patient Name: Cesar Morales Date of Encounter: 01/11/2019  Primary Cardiologist: Cesar Breeding, MD   Subjective   Continues to have chest pain.  The current episode started at 5 AM and has been continuous.  He just received some pain medication from the nurse.  Nitroglycerin makes his head hurt.  The pain is not pleuritic or positional.  It gets slightly worse with ambulation.  Inpatient Medications    Scheduled Meds: . aspirin EC  81 mg Oral Daily  . carvedilol  6.25 mg Oral BID WC  . citalopram  40 mg Oral Daily  . gabapentin  600 mg Oral TID PC & HS  . hydrochlorothiazide  25 mg Oral Daily  . insulin aspart  0-9 Units Subcutaneous TID WC  . pantoprazole  40 mg Oral Daily   Continuous Infusions: . nitroGLYCERIN 10 mcg/min (01/10/19 0003)   PRN Meds: acetaminophen **OR** acetaminophen, albuterol, ALPRAZolam, morphine injection, ondansetron (ZOFRAN) IV   Vital Signs    Vitals:   01/11/19 0213 01/11/19 0304 01/11/19 0744 01/11/19 0816  BP:  121/83 (!) 150/81 (!) 163/101  Pulse:  (!) 56 61 61  Resp:  20 20 20   Temp:  (!) 97.4 F (36.3 C) 98 F (36.7 C) 98 F (36.7 C)  TempSrc:  Oral  Oral  SpO2:  95% 96% 96%  Weight: 117.6 kg     Height:        Intake/Output Summary (Last 24 hours) at 01/11/2019 0917 Last data filed at 01/11/2019 0600 Gross per 24 hour  Intake 3083.49 ml  Output 1800 ml  Net 1283.49 ml   Last 3 Weights 01/11/2019 01/10/2019 01/09/2019  Weight (lbs) 259 lb 4.8 oz 260 lb 1.6 oz 261 lb 1.6 oz  Weight (kg) 117.618 kg 117.981 kg 118.434 kg      Telemetry    Sinus rhythm, sinus bradycardia. - Personally Reviewed  ECG    n/a - Personally Reviewed  Physical Exam   GEN: Mild distress.   Neck: No JVD Cardiac: RRR, no murmurs, rubs, or gallops.  Respiratory: Clear to auscultation bilaterally. GI: Soft, nontender, non-distended  MS: No edema; No deformity. Neuro:  Nonfocal  Psych: Normal affect   Labs    High Sensitivity  Troponin:   Recent Labs  Lab 01/09/19 2244 01/10/19 0222  TROPONINIHS 7 7      Chemistry Recent Labs  Lab 01/10/19 0222  NA 136  K 4.3  CL 103  CO2 23  GLUCOSE 114*  BUN 14  CREATININE 0.89  CALCIUM 8.7*  PROT 6.4*  ALBUMIN 3.5  AST 20  ALT 19  ALKPHOS 63  BILITOT 1.0  GFRNONAA >60  GFRAA >60  ANIONGAP 10     Hematology Recent Labs  Lab 01/10/19 0222  WBC 10.8*  RBC 4.82  HGB 14.4  HCT 43.9  MCV 91.1  MCH 29.9  MCHC 32.8  RDW 14.1  PLT 217    BNPNo results for input(s): BNP, PROBNP in the last 168 hours.   DDimer No results for input(s): DDIMER in the last 168 hours.   Radiology    No results found.  Cardiac Studies   Echo 01/10/19: IMPRESSIONS    1. The left ventricle has low normal systolic function, with an ejection fraction of 50-55%. The cavity size was moderately dilated. There is mildly increased left ventricular wall thickness. Left ventricular diastolic Doppler parameters are consistent  with impaired relaxation. No evidence of left ventricular regional wall motion abnormalities.  2. The right ventricle has normal systolic function. The cavity was normal. There is no increase in right ventricular wall thickness. Right ventricular systolic pressure could not be assessed.  3. The aortic valve is tricuspid. Mild sclerosis of the aortic valve. Aortic valve regurgitation is mild by color flow Doppler.  4. The aorta is normal unless otherwise noted.  5. The inferior vena cava was dilated in size with >50% respiratory variability.   Patient Profile     Cesar Morales is a 58 year old man with CAD (80% OM), hypertension, hyperlipidemia, and diabetes here with chest pain.  Assessment & Plan    # Chest pain: # Hyperlipidemia: # CAD: Cesar Morales has a known OM disease that is not amenable to PCI.  He is here with chest pain.  Cardiac enzymes remain negative.  He will go for coronary CT-a today.  LDL is not at goal.  He has not tolerated statins.  We  will start Zetia.  If he does not get to goal (LDL less than 70) then he would be a good candidate for PCSK9 inhibitors.  #Hypertension: Blood pressure is poorly-controlled.  His home chlorthalidone was switched to hydrochlorothiazide in the hospital.  He should go back on chlorthalidone when he is discharged.  Carvedilol has been added but cannot be titrated due to bradycardia.  We will add amlodipine 5 mg daily.  He has a history of angioedema with ACE inhibitors.  # Diabetes:  Management per primary team.       For questions or updates, please contact [facility] Please consult [email or web address] for contact info under        Signed, Cesar Si, MD  01/11/2019, 9:17 AM

## 2019-01-12 ENCOUNTER — Other Ambulatory Visit: Payer: Self-pay

## 2019-01-13 ENCOUNTER — Encounter: Payer: Self-pay | Admitting: Family

## 2019-01-13 ENCOUNTER — Ambulatory Visit (INDEPENDENT_AMBULATORY_CARE_PROVIDER_SITE_OTHER): Payer: Self-pay | Admitting: Family

## 2019-01-13 VITALS — BP 122/73 | HR 68 | Temp 97.8°F | Ht 72.0 in | Wt 260.8 lb

## 2019-01-13 DIAGNOSIS — I25119 Atherosclerotic heart disease of native coronary artery with unspecified angina pectoris: Secondary | ICD-10-CM

## 2019-01-13 DIAGNOSIS — R079 Chest pain, unspecified: Secondary | ICD-10-CM

## 2019-01-13 DIAGNOSIS — Z09 Encounter for follow-up examination after completed treatment for conditions other than malignant neoplasm: Secondary | ICD-10-CM

## 2019-01-13 DIAGNOSIS — K219 Gastro-esophageal reflux disease without esophagitis: Secondary | ICD-10-CM

## 2019-01-13 DIAGNOSIS — R911 Solitary pulmonary nodule: Secondary | ICD-10-CM

## 2019-01-13 DIAGNOSIS — M069 Rheumatoid arthritis, unspecified: Secondary | ICD-10-CM

## 2019-01-13 DIAGNOSIS — Z720 Tobacco use: Secondary | ICD-10-CM

## 2019-01-13 DIAGNOSIS — I10 Essential (primary) hypertension: Secondary | ICD-10-CM

## 2019-01-13 NOTE — Progress Notes (Signed)
Subjective:    Patient ID: Cesar Morales, male    DOB: November 14, 1960, 58 y.o.   MRN: [id number]  Chief Complaint  Patient presents with  . Hospitalization Follow-up   PT presents to the office today for hospital follow up. He went to the ED on 01/09/19 with chest pain. He had a negative CT chest  & Ct-A negative, trop negative. He was cleared by Cardiologists. He has follow up appt next week.   He states his Rhuematolgoists has started him on methotrexate with his his Simponi and he has noticed the swelling in his hands have improved. He continues to have pain, but it is improving.  Hypertension This is a chronic problem. The current episode started more than 1 year ago. The problem has been resolved since onset. The problem is controlled. Associated symptoms include chest pain, malaise/fatigue and peripheral edema ("some times"). Pertinent negatives include no shortness of breath. Risk factors for coronary artery disease include dyslipidemia, family history, obesity, smoking/tobacco exposure and sedentary lifestyle. The current treatment provides moderate improvement. Hypertensive end-organ damage includes CAD/MI.  Chest Pain  This is a recurrent problem. The current episode started in the past 7 days. The problem occurs intermittently. The problem has been waxing and waning. The pain is present in the epigastric region. The pain is at a severity of 4/10. The pain is mild. The quality of the pain is described as pressure. The pain does not radiate. Associated symptoms include malaise/fatigue. Pertinent negatives include no shortness of breath.  His past medical history is significant for hypertension.      Review of Systems  Constitutional: Positive for malaise/fatigue.  Respiratory: Negative for shortness of breath.   Cardiovascular: Positive for chest pain.  All other systems reviewed and are negative.      Objective:   Physical Exam Vitals signs reviewed.  Constitutional:    General: He is not in acute distress.    Appearance: He is well-developed.  HENT:     Head: Normocephalic.     Right Ear: External ear normal.     Left Ear: External ear normal.  Eyes:     General:        Right eye: No discharge.        Left eye: No discharge.     Pupils: Pupils are equal, round, and reactive to light.  Neck:     Musculoskeletal: Normal range of motion and neck supple.     Thyroid: No thyromegaly.  Cardiovascular:     Rate and Rhythm: Normal rate and regular rhythm.     Heart sounds: Normal heart sounds. No murmur.  Pulmonary:     Effort: Pulmonary effort is normal. No respiratory distress.     Breath sounds: Wheezing present.  Abdominal:     General: Bowel sounds are normal. There is no distension.     Palpations: Abdomen is soft.     Tenderness: There is no abdominal tenderness.  Musculoskeletal: Normal range of motion.        General: No tenderness.  Skin:    General: Skin is warm and dry.     Findings: No erythema or rash.  Neurological:     Mental Status: He is alert and oriented to person, place, and time.     Cranial Nerves: No cranial nerve deficit.     Deep Tendon Reflexes: Reflexes are normal and symmetric.  Psychiatric:        Behavior: Behavior normal.        Thought  Content: Thought content normal.        Judgment: Judgment normal.       BP 122/73   Pulse 68   Temp 97.8 F (36.6 C) (Oral)   Ht 6' (1.829 m)   Wt 260 lb 12.8 oz (118.3 kg)   BMI 35.37 kg/m      Assessment & Plan:  Cesar Morales comes in today with chief complaint of Hospitalization Follow-up   Diagnosis and orders addressed:  1. Coronary artery disease involving native coronary artery of native heart with angina pectoris (HCC) - CMP14+EGFR - CBC with Differential/Platelet  2. Hospital discharge follow-up - CMP14+EGFR - CBC with Differential/Platelet  3. Tobacco abuse Smoking cessation discussed  - CMP14+EGFR - CBC with Differential/Platelet  4. Morbid  obesity ([place]) - CMP14+EGFR - CBC with Differential/Platelet  5. Gastroesophageal reflux disease, esophagitis presence not specified - CMP14+EGFR - CBC with Differential/Platelet  6. Essential hypertension - CMP14+EGFR - CBC with Differential/Platelet  7. Pulmonary nodule Will repeat CT scan in one year - CMP14+EGFR - CBC with Differential/Platelet  8. Rheumatoid arthritis involving multiple sites, unspecified rheumatoid factor presence ([place]) Keep follow up with Rheumatologists  - CMP14+EGFR - CBC with Differential/Platelet   9. Chest pain, unspecified type Keep Cardiologists appt    Labs pending Health Maintenance reviewed Diet and exercise encouraged  Follow up plan: 3 months    Evelina Dun, FNP

## 2019-01-13 NOTE — Patient Instructions (Signed)

## 2019-01-14 ENCOUNTER — Other Ambulatory Visit: Payer: Self-pay | Admitting: Cardiology

## 2019-01-14 DIAGNOSIS — I200 Unstable angina: Secondary | ICD-10-CM

## 2019-01-14 LAB — CBC WITH DIFFERENTIAL/PLATELET
Basophils Absolute: 0.1 10*3/uL (ref 0.0–0.2)
Basos: 1 %
EOS (ABSOLUTE): 0.2 10*3/uL (ref 0.0–0.4)
Eos: 2 %
Hematocrit: 45.6 % (ref 37.5–51.0)
Hemoglobin: 16.1 g/dL (ref 13.0–17.7)
Immature Grans (Abs): 0 10*3/uL (ref 0.0–0.1)
Immature Granulocytes: 0 %
Lymphocytes Absolute: 2.8 10*3/uL (ref 0.7–3.1)
Lymphs: 24 %
MCH: 30.3 pg (ref 26.6–33.0)
MCHC: 35.3 g/dL (ref 31.5–35.7)
MCV: 86 fL (ref 79–97)
Monocytes Absolute: 0.5 10*3/uL (ref 0.1–0.9)
Monocytes: 4 %
Neutrophils Absolute: 8.1 10*3/uL — ABNORMAL HIGH (ref 1.4–7.0)
Neutrophils: 69 %
Platelets: 268 10*3/uL (ref 150–450)
RBC: 5.31 x10E6/uL (ref 4.14–5.80)
RDW: 13.3 % (ref 11.6–15.4)
WBC: 11.7 10*3/uL — ABNORMAL HIGH (ref 3.4–10.8)

## 2019-01-14 LAB — CMP14+EGFR
ALT: 18 IU/L (ref 0–44)
AST: 11 IU/L (ref 0–40)
Albumin/Globulin Ratio: 1.9 (ref 1.2–2.2)
Albumin: 4.5 g/dL (ref 3.8–4.9)
Alkaline Phosphatase: 95 IU/L (ref 39–117)
BUN/Creatinine Ratio: 15 (ref 9–20)
BUN: 16 mg/dL (ref 6–24)
Bilirubin Total: 0.6 mg/dL (ref 0.0–1.2)
CO2: 24 mmol/L (ref 20–29)
Calcium: 9.5 mg/dL (ref 8.7–10.2)
Chloride: 101 mmol/L (ref 96–106)
Creatinine, Ser: 1.06 mg/dL (ref 0.76–1.27)
GFR calc Af Amer: 89 mL/min/{1.73_m2} (ref >59)
GFR calc non Af Amer: 77 mL/min/{1.73_m2} (ref >59)
Globulin, Total: 2.4 g/dL (ref 1.5–4.5)
Glucose: 122 mg/dL — ABNORMAL HIGH (ref 65–99)
Potassium: 4.3 mmol/L (ref 3.5–5.2)
Sodium: 140 mmol/L (ref 134–144)
Total Protein: 6.9 g/dL (ref 6.0–8.5)

## 2019-01-17 ENCOUNTER — Encounter (HOSPITAL_COMMUNITY): Payer: Self-pay | Admitting: Cardiology

## 2019-01-31 NOTE — Progress Notes (Signed)
Cardiology Office Note   Date:  02/02/2019   ID:  Cesar Morales, DOB 29-Jan-1961, MRN [id number]  PCP:  Sharion Balloon, FNP  Cardiologist:  Dr. Percival Spanish  No chief complaint on file.    History of Present Illness: Cesar Morales is a 58 y.o. male who presents for post hospital follow up after admission for chest pain on 01/09/2019. Cesar Morales has a history of CAD, COPD, Hypertension, NIDDM Type II, Diabetic neuropathy, dyslipidemia, and tobacco abuse. He was initially seen at [facility] but was transferred to [facility] after ruling out PE.   Seen on consult during hospitalization by cardiology fellow and followed by Dr. Oval Linsey. He was noted to have cardiac cath in 2018 which demonstrated 80% OM lesion with non-obstructive CAD. It was felt that his chest pain was non-cardiac.    He presents to the clinic today and states for the last 2 months he has had consistent chest pressure.  He has been taking nitroglycerin 2-3 times per week when he experiences more pronounced chest heaviness or pressure.  He notices that the nitroglycerin use does help with his chest discomfort.  He states that he has been very inactive for the last 2 years due to his shortness of breath with exertion.  He states he has been eating convenience foods such as soups, sandwiches, and sausage twice per week.  He also states over the last 2 months he has had 3 episodes of palpitations that are brief in nature and subside with rest.  He denies chest pain,  lower extremity edema, fatigue, palpitations, melena, hematuria, hemoptysis, diaphoresis, weakness, presyncope, syncope, orthopnea, and PND.  Past Medical History:  Diagnosis Date  . COPD (chronic obstructive pulmonary disease) ([place])   . Coronary artery disease   . Diabetes mellitus without complication ([place])   . Dyslipidemia   . HTN (hypertension)   . Tobacco abuse     Past Surgical History:  Procedure Laterality Date  . CHOLECYSTECTOMY    . HERNIA REPAIR    . LEFT  HEART CATH AND CORONARY ANGIOGRAPHY N/A 02/24/2017   Procedure: LEFT HEART CATH AND CORONARY ANGIOGRAPHY;  Surgeon: Leonie Man, MD;  Location: [place] [facility];  Service: Cardiovascular;  Laterality: N/A;     Current Outpatient Medications  Medication Sig Dispense Refill  . albuterol (PROVENTIL HFA;VENTOLIN HFA) 108 (90 Base) MCG/ACT inhaler Inhale 2 puffs into the lungs every 6 (six) hours as needed for wheezing or shortness of breath. 1 Inhaler 2  . amLODipine (NORVASC) 5 MG tablet Take 1 tablet (5 mg total) by mouth daily. 30 tablet 1  . aspirin EC 81 MG tablet Take 81 mg by mouth daily.    . bisacodyl (DULCOLAX) 5 MG EC tablet Take 5 mg by mouth as needed for moderate constipation.    . carvedilol (COREG) 6.25 MG tablet Take 1 tablet (6.25 mg total) by mouth 2 (two) times daily with a meal. 30 tablet 1  . chlorthalidone (HYGROTON) 25 MG tablet Take 25 mg by mouth daily.    . citalopram (CELEXA) 40 MG tablet Take 40 mg by mouth daily.    Marland Kitchen ezetimibe (ZETIA) 10 MG tablet Take 1 tablet (10 mg total) by mouth daily. 90 tablet 3  . folic acid (FOLVITE) 1 MG tablet Take 1 mg by mouth daily.    Marland Kitchen gabapentin (NEURONTIN) 600 MG tablet Take 1 tablet (600 mg total) by mouth 4 (four) times daily - after meals and at bedtime. 120 tablet 1  .  Golimumab (SIMPONI) 50 MG/0.5ML SOAJ Inject 50 mg into the skin every 30 (thirty) days.     . isosorbide mononitrate (IMDUR) 120 MG 24 hr tablet Take 1 tablet (120 mg total) by mouth every morning. 90 tablet 3  . metFORMIN (GLUCOPHAGE) 1000 MG tablet Take 1 tablet (1,000 mg total) by mouth 2 (two) times daily with a meal. 180 tablet 3  . methotrexate (RHEUMATREX) 2.5 MG tablet Take 15 mg by mouth once a week. Caution:Chemotherapy. Protect from light. Take on Sunday    . naproxen (NAPROSYN) 500 MG tablet Take 500 mg by mouth as needed.    . nitroGLYCERIN (NITROSTAT) 0.4 MG SL tablet Place 1 tablet (0.4 mg total) under the tongue every 5 (five) minutes as  needed for chest pain. 25 tablet 1  . pantoprazole (PROTONIX) 40 MG tablet Take 1 tablet (40 mg total) by mouth daily. 30 tablet 1  . prednisoLONE 5 MG TABS tablet Take 5 mg by mouth daily. Also take  An additional 5 mg as needed    . traZODone (DESYREL) 50 MG tablet Take 1-2 tablets (50-100 mg total) by mouth at bedtime as needed for sleep. 60 tablet 3  . isosorbide mononitrate (IMDUR) 60 MG 24 hr tablet Take 1 tablet (60 mg total) by mouth every evening. 90 tablet 3   No current facility-administered medications for this visit.     Allergies:   Lisinopril and Lipitor [atorvastatin]    Social History:  The patient  reports that he has been smoking. He has been smoking about 0.50 packs per day. He has never used smokeless tobacco. He reports that he does not drink alcohol or use drugs.   Family History:  The patient's family history includes Cancer in his father; Heart disease in his father; Heart disease (age of onset: 41) in his mother.    ROS: All other systems are reviewed and negative. Unless otherwise mentioned in H&P    PHYSICAL EXAM: VS:  BP 136/86   Pulse 66   Ht 6' (1.829 m)   Wt 265 lb 6.4 oz (120.4 kg)   SpO2 98%   BMI 35.99 kg/m  , BMI Body mass index is 35.99 kg/m. GEN: Well nourished, well developed, in no acute distress HEENT: normal Neck: no JVD, carotid bruits, or masses Cardiac:RRR; no murmurs, rubs, or gallops,no edema  Respiratory:  Clear to auscultation bilaterally, normal work of breathing GI: soft, nontender, nondistended, + BS MS: no deformity or atrophy Skin: warm and dry, no rash Neuro:  Strength and sensation are intact Psych: euthymic mood, full affect   EKG:  EKG is not ordered today.  EKG 01/09/2019 Sinus bradycardia left axis deviation 57 bpm  EKG 07/05/2017 Normal sinus rhythm 81 bpm  Recent Labs: 01/13/2019: ALT 18; BUN 16; Creatinine, Ser 1.06; Hemoglobin 16.1; Platelets 268; Potassium 4.3; Sodium 140    Lipid Panel    Component  Value Date/Time   CHOL 198 01/10/2019 0222   CHOL 197 08/25/2016 0908   TRIG 185 (H) 01/10/2019 0222   HDL 34 (L) 01/10/2019 0222   HDL 33 (L) 08/25/2016 0908   CHOLHDL 5.8 01/10/2019 0222   VLDL 37 01/10/2019 0222   LDLCALC 127 (H) 01/10/2019 0222   LDLCALC 109 (H) 08/25/2016 0908      Wt Readings from Last 3 Encounters:  02/02/19 265 lb 6.4 oz (120.4 kg)  01/13/19 260 lb 12.8 oz (118.3 kg)  01/11/19 259 lb 4.8 oz (117.6 kg)      Other  studies Reviewed: LHC 02-24-17  Ost 1st Mrg to 1st Mrg lesion, 85 %stenosed.  Ost RPDA to RPDA lesion, 35 %stenosed.  Mid LAD-1 lesion, 20 %stenosed. Mid LAD-2 lesion, 35 %stenosed.  Ost 2nd Mrg lesion, 40 %stenosed.  There is mild left ventricular systolic dysfunction.  The left ventricular ejection fraction is 45-50% by visual estimate. Cannot exclude mild anterior hypokinesis - suggest 2 D Echo. The patient does not have small arteries. He has large patulous vessels, but what may be causing symptoms is the small caliber OM1 branch which does appear to have a severe lesion proximally. Unfortunately this is an ostial lesion all branches that therefore not a good PCI target.  This lesion is also not likely causing and had continuous 4-5/10 chest pain at rest. Would consider nonanginal etiology.   TTE 08-10-15 Left ventricle: The cavity size was normal. Wall thickness was normal. Systolic function was normal. The estimated ejection fraction was in the range of 60% to 65%. Wall motion was normal; there were no regional wall motion abnormalities. Left ventricular diastolic function parameters were normal. - Aortic valve: Mildly calcified annulus. Trileaflet; normal thickness leaflets. Valve area (VTI): 3.78 cm^2. Valve area (Vmax): 3.07 cm^2. - Atrial septum: No defect or patent foramen ovale was identified. - Technically adequate study.   ASSESSMENT AND PLAN:  1.  Coronary artery disease- complains of constant chest  pressure for the last 2 years that has become more prominent over the last 2 months.  Cardiac catheterization on 02/24/2017 showed an OM lesion with 85% stenosis and nonobstructive coronary artery disease.  Chest pain was considered non-anginal in etiology.  Taking nitroglycerin 2-3 times per week. Increase isosorbide mononitrate to 120 mg in the a.m. and 60 mg at bedtime Continue carvedilol 6.25 mg twice daily Continue amlodipine 5 mg tablet daily Continue aspirin 81 mg tablet daily Continue chlorthalidone 25 mg tablet daily Continue Zetia 10 mg tablet daily Present to scheduled myocardial perfusion study on September 18 Heart healthy low-sodium diet-salty 6 instructions provided  2. Essential hypertension- blood pressure today 136/86 Continue carvedilol 6.25 mg twice daily Continue amlodipine 5 mg tablet daily Continue chlorthalidone 25 mg tablet daily Increase isosorbide mononitrate to 120 mg in the a.m. and 60 mg at bedtime Heart healthy low-sodium diet-salty 6 instructions provided  3.  Hyperlipidemia-01/10/2019: Cholesterol 198; HDL 34; LDL Cholesterol 127; Triglycerides 185; VLDL 37 Continue Zetia 10 mg daily Follow-up with pharmacy for PCSK9 and other lipid-lowering options   4.  Smoking cessation- patient smoking around 5 cigarettes/day and states he is trying to quit. Smoking cessation instructions offered- declined by patient  Current medicines are reviewed at length with the patient today.    Disposition: Follow-up with Dr. Antoine Poche or APP in 1 month  Labs/ tests ordered today include: None today  Edd Fabian NP-C    02/02/2019 12:10 PM    [facility] [address] Office [phone] Fax [phone]

## 2019-02-02 ENCOUNTER — Encounter: Payer: Self-pay | Admitting: Adult Health

## 2019-02-02 ENCOUNTER — Other Ambulatory Visit: Payer: Self-pay

## 2019-02-02 ENCOUNTER — Ambulatory Visit (INDEPENDENT_AMBULATORY_CARE_PROVIDER_SITE_OTHER): Payer: Self-pay | Admitting: General Practice

## 2019-02-02 ENCOUNTER — Telehealth (HOSPITAL_COMMUNITY): Payer: Self-pay

## 2019-02-02 VITALS — BP 136/86 | HR 66 | Ht 72.0 in | Wt 265.4 lb

## 2019-02-02 DIAGNOSIS — I10 Essential (primary) hypertension: Secondary | ICD-10-CM

## 2019-02-02 DIAGNOSIS — E785 Hyperlipidemia, unspecified: Secondary | ICD-10-CM

## 2019-02-02 DIAGNOSIS — I25110 Atherosclerotic heart disease of native coronary artery with unstable angina pectoris: Secondary | ICD-10-CM

## 2019-02-02 MED ORDER — ISOSORBIDE MONONITRATE ER 60 MG PO TB24: 60.0000 mg | ORAL_TABLET | Freq: Every evening | ORAL | 3 refills | Status: AC

## 2019-02-02 MED ORDER — EZETIMIBE 10 MG PO TABS: 10.0000 mg | ORAL_TABLET | Freq: Every day | ORAL | 3 refills | Status: DC

## 2019-02-02 MED ORDER — ISOSORBIDE MONONITRATE ER 120 MG PO TB24: 120.0000 mg | ORAL_TABLET | ORAL | 3 refills | Status: DC

## 2019-02-02 NOTE — Patient Instructions (Addendum)
Medication Instructions:  START- Imdur 60 mg by mouth at bedtime TAKE- Imdur 120 mg by mouth in the morning  If you need a refill on your cardiac medications before your next appointment, please call your pharmacy.  Labwork: None Ordered   Testing/Procedures: None Ordered  . Your physician recommends that you schedule a follow-up appointment in: 1 Month with Dr Oval Linsey or Coletta Memos . Your physician recommends that you schedule a follow-up appointment in: First available appointment in [facility] with Erasmo Downer or Raquel   At [facility], you and your health needs are our priority.  As part of our continuing mission to provide you with exceptional heart care, we have created designated Provider Care Teams.  These Care Teams include your primary Cardiologist (physician) and Advanced Practice Providers (APPs -  Physician Assistants and Nurse Practitioners) who all work together to provide you with the care you need, when you need it.  Thank you for choosing [facility] at [facility]!!

## 2019-02-02 NOTE — Telephone Encounter (Signed)
Encounter complete. 

## 2019-02-04 ENCOUNTER — Ambulatory Visit (HOSPITAL_COMMUNITY)
Admission: RE | Admit: 2019-02-04 | Discharge: 2019-02-04 | Disposition: A | Discharge status: Home / Self Care | Payer: Self-pay | Source: Ambulatory Visit | Attending: Cardiology | Admitting: Cardiology

## 2019-02-04 ENCOUNTER — Other Ambulatory Visit: Payer: Self-pay

## 2019-02-04 DIAGNOSIS — I200 Unstable angina: Secondary | ICD-10-CM

## 2019-02-04 LAB — MYOCARDIAL PERFUSION IMAGING
LV dias vol: 165 mL (ref 62–150)
LV sys vol: 88 mL
Peak HR: 88 {beats}/min
Rest HR: 57 {beats}/min
SDS: 0
SRS: 3
SSS: 3
TID: 1.11

## 2019-02-04 MED ORDER — TECHNETIUM TC 99M TETROFOSMIN IV KIT
29.2000 | PACK | Freq: Once | INTRAVENOUS | Status: AC | PRN
  Administered 2019-02-04: 29.3 via INTRAVENOUS
  Filled 2019-02-04: qty 30

## 2019-02-04 MED ORDER — REGADENOSON 0.4 MG/5ML IV SOLN
0.4000 mg | Freq: Once | INTRAVENOUS | Status: AC
  Administered 2019-02-04: 0.4 mg via INTRAVENOUS

## 2019-02-04 MED ORDER — TECHNETIUM TC 99M TETROFOSMIN IV KIT
9.6000 | PACK | Freq: Once | INTRAVENOUS | Status: AC | PRN
  Administered 2019-02-04: 9.4 via INTRAVENOUS
  Filled 2019-02-04: qty 10

## 2019-02-14 ENCOUNTER — Other Ambulatory Visit: Payer: Self-pay | Admitting: Family

## 2019-02-14 DIAGNOSIS — E1142 Type 2 diabetes mellitus with diabetic polyneuropathy: Secondary | ICD-10-CM

## 2019-02-15 ENCOUNTER — Other Ambulatory Visit: Payer: Self-pay | Admitting: Family

## 2019-02-15 DIAGNOSIS — G4700 Insomnia, unspecified: Secondary | ICD-10-CM

## 2019-02-16 ENCOUNTER — Telehealth: Payer: Self-pay | Admitting: Family

## 2019-02-16 ENCOUNTER — Other Ambulatory Visit: Payer: Self-pay | Admitting: Family

## 2019-02-16 ENCOUNTER — Other Ambulatory Visit: Payer: Self-pay | Admitting: *Deleted

## 2019-02-16 DIAGNOSIS — E1142 Type 2 diabetes mellitus with diabetic polyneuropathy: Secondary | ICD-10-CM

## 2019-02-16 DIAGNOSIS — G4700 Insomnia, unspecified: Secondary | ICD-10-CM

## 2019-02-16 MED ORDER — CARVEDILOL 6.25 MG PO TABS: 6.2500 mg | ORAL_TABLET | Freq: Two times a day (BID) | ORAL | 1 refills | Status: DC

## 2019-02-16 MED ORDER — GABAPENTIN 600 MG PO TABS: 600.0000 mg | ORAL_TABLET | Freq: Three times a day (TID) | ORAL | 1 refills | Status: AC

## 2019-02-16 MED ORDER — AMLODIPINE BESYLATE 5 MG PO TABS: 5.0000 mg | ORAL_TABLET | Freq: Every day | ORAL | 1 refills | Status: DC

## 2019-02-16 NOTE — Telephone Encounter (Signed)
Refill sent.

## 2019-02-16 NOTE — Telephone Encounter (Signed)
What is the name of the medication? gabapentin (NEURONTIN) 600 MG tablet   Have you contacted your pharmacy to request a refill? Yes  Which pharmacy would you like this sent to? [facility]. If denied please call back and tell pt why.   Patient notified that their request is being sent to the clinical staff for review and that they should receive a call once it is complete. If they do not receive a call within 24 hours they can check with their pharmacy or our office.

## 2019-02-23 ENCOUNTER — Ambulatory Visit (INDEPENDENT_AMBULATORY_CARE_PROVIDER_SITE_OTHER): Payer: Self-pay | Admitting: Pharmacist

## 2019-02-23 ENCOUNTER — Other Ambulatory Visit: Payer: Self-pay

## 2019-02-23 VITALS — BP 140/80 | HR 78 | Resp 14 | Ht 72.0 in | Wt 269.0 lb

## 2019-02-23 DIAGNOSIS — E785 Hyperlipidemia, unspecified: Secondary | ICD-10-CM

## 2019-02-23 DIAGNOSIS — T466X5A Adverse effect of antihyperlipidemic and antiarteriosclerotic drugs, initial encounter: Secondary | ICD-10-CM

## 2019-02-23 DIAGNOSIS — G720 Drug-induced myopathy: Secondary | ICD-10-CM

## 2019-02-23 DIAGNOSIS — I10 Essential (primary) hypertension: Secondary | ICD-10-CM

## 2019-02-23 DIAGNOSIS — I25119 Atherosclerotic heart disease of native coronary artery with unspecified angina pectoris: Secondary | ICD-10-CM

## 2019-02-23 MED ORDER — OMEGA 3 1000 MG PO CAPS: 1.0000 | ORAL_CAPSULE | Freq: Two times a day (BID) | ORAL | 1 refills | Status: DC

## 2019-02-23 MED ORDER — AMLODIPINE BESYLATE 5 MG PO TABS: ORAL_TABLET | ORAL | 1 refills | Status: DC

## 2019-02-23 NOTE — Patient Instructions (Addendum)
Check your blood pressure at home daily (if able) and keep record of the readings.  Take your BP meds as follows: *INCREASE Amlodipine dose to 2.5mg  every morning and 5mg  every evening*  Bring all of your meds, your BP cuff and your record of home blood pressures to your next appointment.  Exercise as you're able, try to walk approximately 30 minutes per day.  Keep salt intake to a minimum, especially watch canned and prepared boxed foods.  Eat more fresh fruits and vegetables and fewer canned items.  Avoid eating in fast food restaurants.    HOW TO TAKE YOUR BLOOD PRESSURE: . Rest 5 minutes before taking your blood pressure. .  Don't smoke or drink caffeinated beverages for at least 30 minutes before. . Take your blood pressure before (not after) you eat. . Sit comfortably with your back supported and both feet on the floor (don't cross your legs). . Elevate your arm to heart level on a table or a desk. . Use the proper sized cuff. It should fit smoothly and snugly around your bare upper arm. There should be enough room to slip a fingertip under the cuff. The bottom edge of the cuff should be 1 inch above the crease of the elbow. . Ideally, take 3 measurements at one sitting and record the average.               Lipid Clinic (pharmacist) Wylene Weissman/Kristin [phone]  *START taking Omega-3 Fatty Acid 1gran twice daily* *START Repatha 140mg  [place] every 14 days once approved by Foundation* *Repeat fasting blood work 3 months after start new therapy*    High Cholesterol  High cholesterol is a condition in which the blood has high levels of a white, waxy, fat-like substance (cholesterol). The human body needs small amounts of cholesterol. The liver makes all the cholesterol that the body needs. Extra (excess) cholesterol comes from the food that we eat. Cholesterol is carried from the liver by the blood through the blood vessels. If you have high cholesterol, deposits (plaques) may  build up on the walls of your blood vessels (arteries). Plaques make the arteries narrower and stiffer. Cholesterol plaques increase your risk for heart attack and stroke. Work with your health care provider to keep your cholesterol levels in a healthy range. What increases the risk? This condition is more likely to develop in people who:  Eat foods that are high in animal fat (saturated fat) or cholesterol.  Are overweight.  Are not getting enough exercise.  Have a family history of high cholesterol. What are the signs or symptoms? There are no symptoms of this condition. How is this diagnosed? This condition may be diagnosed from the results of a blood test.  If you are older than age 65, your health care provider may check your cholesterol every 4-6 years.  You may be checked more often if you already have high cholesterol or other risk factors for heart disease. The blood test for cholesterol measures:  "Bad" cholesterol (LDL cholesterol). This is the main type of cholesterol that causes heart disease. The desired level for LDL is less than 100.  "Good" cholesterol (HDL cholesterol). This type helps to protect against heart disease by cleaning the arteries and carrying the LDL away. The desired level for HDL is 60 or higher.  Triglycerides. These are fats that the body can store or burn for energy. The desired number for triglycerides is lower than 150.  Total cholesterol. This is a measure of the total  amount of cholesterol in your blood, including LDL cholesterol, HDL cholesterol, and triglycerides. A healthy number is less than 200. How is this treated? This condition is treated with diet changes, lifestyle changes, and medicines. Diet changes  This may include eating more whole grains, fruits, vegetables, nuts, and fish.  This may also include cutting back on red meat and foods that have a lot of added sugar. Lifestyle changes  Changes may include getting at least 40  minutes of aerobic exercise 3 times a week. Aerobic exercises include walking, biking, and swimming. Aerobic exercise along with a healthy diet can help you maintain a healthy weight.  Changes may also include quitting smoking. Medicines  Medicines are usually given if diet and lifestyle changes have failed to reduce your cholesterol to healthy levels.  Your health care provider may prescribe a statin medicine. Statin medicines have been shown to reduce cholesterol, which can reduce the risk of heart disease. Follow these instructions at home: Eating and drinking If told by your health care provider:  Eat chicken (without skin), fish, veal, shellfish, ground [place] breast, and round or loin cuts of red meat.  Do not eat fried foods or fatty meats, such as hot dogs and salami.  Eat plenty of fruits, such as apples.  Eat plenty of vegetables, such as broccoli, potatoes, and carrots.  Eat beans, peas, and lentils.  Eat grains such as barley, rice, couscous, and bulgur wheat.  Eat pasta without cream sauces.  Use skim or nonfat milk, and eat low-fat or nonfat yogurt and cheeses.  Do not eat or drink whole milk, cream, ice cream, egg yolks, or hard cheeses.  Do not eat stick margarine or tub margarines that contain trans fats (also called partially hydrogenated oils).  Do not eat saturated tropical oils, such as coconut oil and palm oil.  Do not eat cakes, cookies, crackers, or other baked goods that contain trans fats.  General instructions  Exercise as directed by your health care provider. Increase your activity level with activities such as gardening, walking, and taking the stairs.  Take over-the-counter and prescription medicines only as told by your health care provider.  Do not use any products that contain nicotine or tobacco, such as cigarettes and e-cigarettes. If you need help quitting, ask your health care provider.  Keep all follow-up visits as told by your health  care provider. This is important. Contact a health care provider if:  You are struggling to maintain a healthy diet or weight.  You need help to start on an exercise program.  You need help to stop smoking. Get help right away if:  You have chest pain.  You have trouble breathing. This information is not intended to replace advice given to you by your health care provider. Make sure you discuss any questions you have with your health care provider. Document Released: 05/05/2005 Document Revised: 05/08/2017 Document Reviewed: 11/03/2015 Elsevier Patient Education  2020 [employer].

## 2019-02-23 NOTE — Progress Notes (Signed)
Patient ID: Cesar Morales                 DOB: 01/06/1961                    MRN: [id number]     HPI: Cesar Morales is a 58 y.o. male patient referred to lipid clinic by . PMH is significant for COPD, hypertension, DM-II, RA, hyperlipidemia, CAD with unstable angina, and tobacco abuse. Patient presents for potential PCSKi initiation. Currently insurance with medication coverage.  Cesar Morales also expressed concern about his elevated BP and will like medication titration if possible.  Current Lipid Medications:  Ezetimibe 10mg  daily  Current BP medication: Amlodipine 5mg  daily Carvedilol 6.25mg  twice daily Chlorthalidone 25mg  daily  Intolerances:  Atorvastatin 10mg  daily Rosuvastatin 10mg  daily Simvastatin 20mg  daily  LDL goal: 70mg /dL or 50% reduction from baseline  Diet: low fat and low glucose (working with PCP on diet as part of diabetes management)  Exercise: minimal due to severe RA  Family History: mother at age 63 and brother MI age 55  Social History: The patient  reports that he has been smoking but decreased amount of cigarettes per day, "almost ready to quit". He has been smoking about 0.50 packs per day. He has never used smokeless tobacco. He reports that he does not drink alcohol or use drugs.   Labs: 01/10/2019: CHO 198, TG 185, HDL 34, LDL-c 127 (ezetimibe 10mg )  Past Medical History:  Diagnosis Date  . COPD (chronic obstructive pulmonary disease) ([place])   . Coronary artery disease   . Diabetes mellitus without complication ([place])   . Dyslipidemia   . HTN (hypertension)   . Tobacco abuse     Current Outpatient Medications on File Prior to Visit  Medication Sig Dispense Refill  . albuterol (PROVENTIL HFA;VENTOLIN HFA) 108 (90 Base) MCG/ACT inhaler Inhale 2 puffs into the lungs every 6 (six) hours as needed for wheezing or shortness of breath. 1 Inhaler 2  . aspirin EC 81 MG tablet Take 81 mg by mouth daily.    . bisacodyl (DULCOLAX) 5 MG EC tablet Take 5 mg  by mouth as needed for moderate constipation.    . carvedilol (COREG) 6.25 MG tablet Take 1 tablet (6.25 mg total) by mouth 2 (two) times daily with a meal. 30 tablet 1  . chlorthalidone (HYGROTON) 25 MG tablet Take 25 mg by mouth daily.    . citalopram (CELEXA) 40 MG tablet Take 40 mg by mouth daily.    Marland Kitchen ezetimibe (ZETIA) 10 MG tablet Take 1 tablet (10 mg total) by mouth daily. 90 tablet 3  . folic acid (FOLVITE) 1 MG tablet Take 1 mg by mouth daily.    Marland Kitchen gabapentin (NEURONTIN) 600 MG tablet Take 1 tablet (600 mg total) by mouth 4 (four) times daily - after meals and at bedtime. 120 tablet 1  . Golimumab (SIMPONI) 50 MG/0.5ML SOAJ Inject 50 mg into the skin every 30 (thirty) days.     . isosorbide mononitrate (IMDUR) 120 MG 24 hr tablet Take 1 tablet (120 mg total) by mouth every morning. 90 tablet 3  . isosorbide mononitrate (IMDUR) 60 MG 24 hr tablet Take 1 tablet (60 mg total) by mouth every evening. 90 tablet 3  . metFORMIN (GLUCOPHAGE) 1000 MG tablet Take 1 tablet (1,000 mg total) by mouth 2 (two) times daily with a meal. 180 tablet 3  . methotrexate (RHEUMATREX) 2.5 MG tablet Take 15 mg by mouth once  a week. Caution:Chemotherapy. Protect from light. Take on Sunday    . naproxen (NAPROSYN) 500 MG tablet Take 500 mg by mouth as needed.    . nitroGLYCERIN (NITROSTAT) 0.4 MG SL tablet Place 1 tablet (0.4 mg total) under the tongue every 5 (five) minutes as needed for chest pain. 25 tablet 1  . pantoprazole (PROTONIX) 40 MG tablet Take 1 tablet (40 mg total) by mouth daily. 30 tablet 1  . prednisoLONE 5 MG TABS tablet Take 5 mg by mouth daily. Also take  An additional 5 mg as needed    . traZODone (DESYREL) 50 MG tablet Take 1-2 tablets (50-100 mg total) by mouth at bedtime as needed for sleep. 60 tablet 3   No current facility-administered medications on file prior to visit.     Allergies  Allergen Reactions  . Lisinopril Swelling    angioedema  . Lipitor [Atorvastatin]     Stomach  cramps    Essential hypertension Blood pressure remains above desired goal. Patient stated PCP added amlodipine 5mg  daily about 2 weeks ago and readings are better but remains above 140s at home.   Will increase amlodipine to 2.5mg  in AM and 5mg  in PM, continue all other BP medication as prescribed, and monitor BP twice daily for next 2 weeks. Plan to follow up as needed.  Dyslipidemia LDL remains above goal for secondary prevention on high risk patient.  Patient compliant with low fat- low carb diet, and ezetimibe. Intolerant to atorvastatin, rosuvastatin and simvastatin in the past. Patient will start taking omega-3 fatty acids 1gm twice daily.  PCSK9i mechanism of action, indication, administration, storage, and common side effects discussed. Will apply for [employer] patient assistance to get Repatha SureClick 140mg . Plan to repeat fasting lipid panel 3 months after initiating Repatha.     Ayani Ospina Rodriguez-Guzman PharmD, BCPS, CPP [facility] [address] [place] [facility] 02/24/2019 3:10 PM

## 2019-02-24 ENCOUNTER — Encounter: Payer: Self-pay | Admitting: Pharmacist

## 2019-02-24 DIAGNOSIS — G720 Drug-induced myopathy: Secondary | ICD-10-CM | POA: Insufficient documentation

## 2019-02-24 DIAGNOSIS — T466X5A Adverse effect of antihyperlipidemic and antiarteriosclerotic drugs, initial encounter: Secondary | ICD-10-CM | POA: Insufficient documentation

## 2019-02-24 NOTE — Assessment & Plan Note (Signed)
LDL remains above goal for secondary prevention on high risk patient.  Patient compliant with low fat- low carb diet, and ezetimibe. Intolerant to atorvastatin, rosuvastatin and simvastatin in the past. Patient will start taking omega-3 fatty acids 1gm twice daily.  PCSK9i mechanism of action, indication, administration, storage, and common side effects discussed. Will apply for [employer] patient assistance to get Repatha SureClick 140mg . Plan to repeat fasting lipid panel 3 months after initiating Repatha.

## 2019-02-24 NOTE — Assessment & Plan Note (Signed)
Blood pressure remains above desired goal. Patient stated PCP added amlodipine 5mg  daily about 2 weeks ago and readings are better but remains above 140s at home.   Will increase amlodipine to 2.5mg  in AM and 5mg  in PM, continue all other BP medication as prescribed, and monitor BP twice daily for next 2 weeks. Plan to follow up as needed.

## 2019-03-04 ENCOUNTER — Telehealth: Payer: Self-pay | Admitting: *Deleted

## 2019-03-04 NOTE — Telephone Encounter (Signed)
Patient walked in wanting clearance to return to work. Tried to call patient, unable to reach. He does have follow up next week.

## 2019-03-07 NOTE — Progress Notes (Signed)
Cardiology Office Note   Date:  03/09/2019   ID:  [name], DOB [birth year], MRN [id number]  PCP:  Junie Spencer, FNP  Cardiologist: Dr. Antoine Poche CC: Follow Up   History of Present Illness: Cesar Morales is a 58 y.o. male who presents for ongoing assessment and management of coronary artery disease, recent hospitalization on 01/09/2019 for recurrent chest pain.  At the time of admission he was evaluated by Dr. Duke Salvia who felt that his chest pain was noncardiac.  Most recent cardiac catheterization was in 2018 which demonstrated 80% OM lesion with otherwise nonobstructive CAD.  Other history includes COPD, hypertension, non-insulin-dependent diabetes type 2, diabetic neuropathy, dyslipidemia, and ongoing tobacco abuse.  He was seen last in the office on 02/02/2019 by Edd Fabian, NP.  At the time of that office visit he endorsed consistent chest pain and pressure for 2 months prior to the office visit.  He has been taking multiple doses of nitroglycerin.  He also complained of brief episodes of palpitations.  On that visit, isosorbide mononitrate was increased to 120 mg in the a.m. and 60 mg in the p.m.  He was scheduled for myocardial perfusion study.  He was advised on a low-cholesterol diet.  He was continued on Zetia 10 mg daily and was advised to follow-up with pharmacy for consideration of PC SK 9 inhibition therapy.  Myoview stress test completed on 02/04/2019, this revealed mildly decreased LVEF of 45% to 54%, no ST segment deviation, there was a small defect of mild severity present in the apical inferior and apex location and was noted as an intermediate risk study due to reduced EF.  This was reviewed by Dr. Antoine Poche his primary cardiologist with notes stating that it was not a high risk study and therefore medical management was recommended.  He was seen by pharmacist on 02/23/2019.  He was initiated on Repatha 140 mg.  He was planned to have follow-up lipid panel in January  2021 to evaluate his response to medication.  Pharmacy applied for patient assistance to get Repatha for this patient.  Cesar Morales comes today with similar complaints. States that he cannot walk more than 1/4 mile without having to stop to breath. He states that his BP at home has not changed.with addition of chlorthalidone. He bring with him copies of his BP records. Range 170's over 90's. Here in the office, BP is much better.    He has applied for disability due to his chronic illnesses and continues to wait for his approval  He is medically compliant.    Past Medical History:  Diagnosis Date   COPD (chronic obstructive pulmonary disease) (HCC)    Coronary artery disease    Diabetes mellitus without complication (HCC)    Dyslipidemia    HTN (hypertension)    Tobacco abuse     Past Surgical History:  Procedure Laterality Date   CHOLECYSTECTOMY     HERNIA REPAIR     LEFT HEART CATH AND CORONARY ANGIOGRAPHY N/A 02/24/2017   Procedure: LEFT HEART CATH AND CORONARY ANGIOGRAPHY;  Surgeon: Marykay Lex, MD;  Location: [facility] INVASIVE CV LAB;  Service: Cardiovascular;  Laterality: N/A;     Current Outpatient Medications  Medication Sig Dispense Refill   albuterol (PROVENTIL HFA;VENTOLIN HFA) 108 (90 Base) MCG/ACT inhaler Inhale 2 puffs into the lungs every 6 (six) hours as needed for wheezing or shortness of breath. 1 Inhaler 2   amLODipine (NORVASC) 5 MG tablet Take 0.5 tablets (2.5 mg total)  by mouth every morning AND 1 tablet (5 mg total) every evening. 45 tablet 1   aspirin EC 81 MG tablet Take 81 mg by mouth daily.     bisacodyl (DULCOLAX) 5 MG EC tablet Take 5 mg by mouth as needed for moderate constipation.     carvedilol (COREG) 6.25 MG tablet Take 1 tablet (6.25 mg total) by mouth 2 (two) times daily with a meal. 30 tablet 1   chlorthalidone (HYGROTON) 25 MG tablet Take 25 mg by mouth daily.     citalopram (CELEXA) 40 MG tablet Take 40 mg by mouth daily.      ezetimibe (ZETIA) 10 MG tablet Take 1 tablet (10 mg total) by mouth daily. 90 tablet 3   folic acid (FOLVITE) 1 MG tablet Take 1 mg by mouth daily.     gabapentin (NEURONTIN) 600 MG tablet Take 1 tablet (600 mg total) by mouth 4 (four) times daily - after meals and at bedtime. 120 tablet 1   Golimumab (SIMPONI) 50 MG/0.5ML SOAJ Inject 50 mg into the skin every 30 (thirty) days.      isosorbide mononitrate (IMDUR) 120 MG 24 hr tablet Take 1 tablet (120 mg total) by mouth every morning. 90 tablet 3   isosorbide mononitrate (IMDUR) 60 MG 24 hr tablet Take 1 tablet (60 mg total) by mouth every evening. 90 tablet 3   metFORMIN (GLUCOPHAGE) 1000 MG tablet Take 1 tablet (1,000 mg total) by mouth 2 (two) times daily with a meal. 180 tablet 3   methotrexate (RHEUMATREX) 2.5 MG tablet Take 15 mg by mouth once a week. Caution:Chemotherapy. Protect from light. Take on Sunday     naproxen (NAPROSYN) 500 MG tablet Take 500 mg by mouth as needed.     nitroGLYCERIN (NITROSTAT) 0.4 MG SL tablet Place 1 tablet (0.4 mg total) under the tongue every 5 (five) minutes as needed for chest pain. 25 tablet 1   Omega 3 1000 MG CAPS Take 1 capsule (1,000 mg total) by mouth 2 (two) times daily. 60 capsule 1   pantoprazole (PROTONIX) 40 MG tablet Take 1 tablet (40 mg total) by mouth daily. 30 tablet 1   prednisoLONE 5 MG TABS tablet Take 5 mg by mouth daily. Also take  An additional 5 mg as needed     traZODone (DESYREL) 50 MG tablet Take 1-2 tablets (50-100 mg total) by mouth at bedtime as needed for sleep. 60 tablet 3   No current facility-administered medications for this visit.     Allergies:   Lisinopril and Lipitor [atorvastatin]    Social History:  The patient  reports that he has been smoking. He has been smoking about 0.50 packs per day. He has never used smokeless tobacco. He reports that he does not drink alcohol or use drugs.   Family History:  The patient's family history includes Cancer in his  father; Heart disease in his father; Heart disease (age of onset: 62) in his mother.    ROS: All other systems are reviewed and negative. Unless otherwise mentioned in H&P    PHYSICAL EXAM: VS:  BP 138/82    Pulse 79    Temp (!) 97 F (36.1 C)    Ht 6' (1.829 m)    Wt 162 lb (73.5 kg)    SpO2 93%    BMI 21.97 kg/m  , BMI Body mass index is 21.97 kg/m. GEN: Well nourished, well developed, in no acute distress,obese. HEENT: normal Neck: no JVD, carotid bruits, or masses  Cardiac: RRR; no murmurs, rubs, or gallops,mild dependent edema  Respiratory:  Clear to auscultation bilaterally, normal work of breathing GI: soft, nontender, nondistended, + BS MS: no deformity or atrophy Skin: warm and dry, no rash Neuro:  Strength and sensation are intact Psych: euthymic mood, full affect   EKG:  Not completed this office visit.   Recent Labs: 01/13/2019: ALT 18; BUN 16; Creatinine, Ser 1.06; Hemoglobin 16.1; Platelets 268; Potassium 4.3; Sodium 140    Lipid Panel    Component Value Date/Time   CHOL 198 01/10/2019 0222   CHOL 197 08/25/2016 0908   TRIG 185 (H) 01/10/2019 0222   HDL 34 (L) 01/10/2019 0222   HDL 33 (L) 08/25/2016 0908   CHOLHDL 5.8 01/10/2019 0222   VLDL 37 01/10/2019 0222   LDLCALC 127 (H) 01/10/2019 0222   LDLCALC 109 (H) 08/25/2016 0908      Wt Readings from Last 3 Encounters:  03/09/19 162 lb (73.5 kg)  02/23/19 269 lb (122 kg)  02/04/19 260 lb (117.9 kg)      Other studies Reviewed: NM stress test: 02/04/2019  Nuclear stress EF: 47%. There is mild generalized hypokinesis.  The left ventricular ejection fraction is mildly decreased (45-54%).  There was no ST segment deviation noted during stress.  Defect 1: There is a small defect of mild severity present in the apical inferior and apex location. This may represent a small area of apical ischemia.  This is an intermediate risk study. Ejection fraction mildly reduced, small area of possible inferoapical  ischemia.      Echocardiogram 01/10/2019 1. The left ventricle has low normal systolic function, with an ejection fraction of 50-55%. The cavity size was moderately dilated. There is mildly increased left ventricular wall thickness. Left ventricular diastolic Doppler parameters are consistent  with impaired relaxation. No evidence of left ventricular regional wall motion abnormalities.  2. The right ventricle has normal systolic function. The cavity was normal. There is no increase in right ventricular wall thickness. Right ventricular systolic pressure could not be assessed.  3. The aortic valve is tricuspid. Mild sclerosis of the aortic valve. Aortic valve regurgitation is mild by color flow Doppler.  4. The aorta is normal unless otherwise noted.  5. The inferior vena cava was dilated in size with >50% respiratory variability.  ASSESSMENT AND PLAN:  1. CAD:  Most recent cardiac cath dated 2018 revealed 80% OM lesion, otherwise non-obstructive disease. F/U myoview was not a high risk study. No further ischemic testing is noted.   2. Chronic DOE:  I think he needs to be tested for OSA with obesity and hypertension. Currently he cannot afford more testing.  He would like to hold off on more testing at this time until he begins to have a paycheck of some for again.   3. Hypertension:  He is normotensive in he office today.  Will continue current regimen without changes.   4. Hyperlipidemia: Now on Repatha.    Current medicines are reviewed at length with the patient today.    Labs/ tests ordered today include: None  Bettey Mare. Liborio Nixon, ANP, AACC   03/09/2019 3:55 PM    [facility] [address] Office [phone] Fax [phone]  Notice: This dictation was prepared with Dragon dictation along with smaller phrase technology. Any transcriptional errors that result from this process are unintentional and may not be corrected upon review.

## 2019-03-09 ENCOUNTER — Other Ambulatory Visit: Payer: Self-pay

## 2019-03-09 ENCOUNTER — Encounter: Payer: Self-pay | Admitting: Adult Health

## 2019-03-09 ENCOUNTER — Ambulatory Visit (INDEPENDENT_AMBULATORY_CARE_PROVIDER_SITE_OTHER): Payer: Self-pay | Admitting: Adult Health

## 2019-03-09 VITALS — BP 138/82 | HR 79 | Temp 97.0°F | Ht 72.0 in | Wt 162.0 lb

## 2019-03-09 DIAGNOSIS — I10 Essential (primary) hypertension: Secondary | ICD-10-CM

## 2019-03-09 DIAGNOSIS — Z23 Encounter for immunization: Secondary | ICD-10-CM

## 2019-03-09 DIAGNOSIS — R0609 Other forms of dyspnea: Secondary | ICD-10-CM

## 2019-03-09 DIAGNOSIS — R0600 Dyspnea, unspecified: Secondary | ICD-10-CM

## 2019-03-09 DIAGNOSIS — I25119 Atherosclerotic heart disease of native coronary artery with unspecified angina pectoris: Secondary | ICD-10-CM

## 2019-03-09 DIAGNOSIS — E785 Hyperlipidemia, unspecified: Secondary | ICD-10-CM

## 2019-03-09 NOTE — Patient Instructions (Signed)
Medication Instructions:  Continue current medications  If you need a refill on your cardiac medications before your next appointment, please call your pharmacy.  Labwork: None Ordered   Testing/Procedures: None Ordered  Follow-Up: IN 4 months Please call our office 2 months in advance,  to schedule this 4 Months appointment. In Person Glenetta Hew, MD.    At [facility], you and your health needs are our priority.  As part of our continuing mission to provide you with exceptional heart care, we have created designated Provider Care Teams.  These Care Teams include your primary Cardiologist (physician) and Advanced Practice Providers (APPs -  Physician Assistants and Nurse Practitioners) who all work together to provide you with the care you need, when you need it.  Thank you for choosing [facility] at [facility]!!

## 2019-03-21 ENCOUNTER — Telehealth: Payer: Self-pay

## 2019-03-21 NOTE — Telephone Encounter (Signed)
Called and spoke w/pt regarding the approval instructed pt to set up deliveries and the pt voiced understanding

## 2019-05-06 ENCOUNTER — Other Ambulatory Visit: Payer: Self-pay | Admitting: Family

## 2019-05-06 DIAGNOSIS — E1142 Type 2 diabetes mellitus with diabetic polyneuropathy: Secondary | ICD-10-CM

## 2019-05-06 DIAGNOSIS — Z794 Long term (current) use of insulin: Secondary | ICD-10-CM

## 2019-06-20 ENCOUNTER — Telehealth: Payer: Self-pay | Admitting: Family

## 2019-06-20 DIAGNOSIS — E1142 Type 2 diabetes mellitus with diabetic polyneuropathy: Secondary | ICD-10-CM

## 2019-06-20 DIAGNOSIS — G4700 Insomnia, unspecified: Secondary | ICD-10-CM

## 2019-06-20 MED ORDER — TRAZODONE HCL 50 MG PO TABS: 50.0000 mg | ORAL_TABLET | Freq: Every evening | ORAL | 3 refills | Status: AC | PRN

## 2019-06-20 MED ORDER — CITALOPRAM HYDROBROMIDE 40 MG PO TABS: 40.0000 mg | ORAL_TABLET | Freq: Every day | ORAL | 1 refills | Status: DC

## 2019-06-20 MED ORDER — METFORMIN HCL 1000 MG PO TABS: 1000.0000 mg | ORAL_TABLET | Freq: Two times a day (BID) | ORAL | 3 refills | Status: DC

## 2019-06-20 MED ORDER — CARVEDILOL 6.25 MG PO TABS: 6.2500 mg | ORAL_TABLET | Freq: Two times a day (BID) | ORAL | 1 refills | Status: DC

## 2019-06-20 MED ORDER — AMLODIPINE BESYLATE 5 MG PO TABS: 5.0000 mg | ORAL_TABLET | Freq: Every day | ORAL | 1 refills | Status: DC

## 2019-06-20 NOTE — Telephone Encounter (Signed)
Mr. Derner would like to know if we can call in 30 day supply of his medicines to [facility]. He has appt with Christy on 07/18/19 and especially needs Metformin and BP medicines refilled as he has been out for 6 weeks. Please call him to let him know if this can be done. Thank you!

## 2019-06-20 NOTE — Telephone Encounter (Signed)
Prescriptions sent to pharmacy

## 2019-06-20 NOTE — Telephone Encounter (Signed)
Aware of results. 

## 2019-07-15 ENCOUNTER — Other Ambulatory Visit: Payer: Self-pay

## 2019-07-18 ENCOUNTER — Ambulatory Visit (INDEPENDENT_AMBULATORY_CARE_PROVIDER_SITE_OTHER): Payer: 59

## 2019-07-18 ENCOUNTER — Telehealth: Payer: Self-pay | Admitting: Family

## 2019-07-18 ENCOUNTER — Other Ambulatory Visit: Payer: Self-pay

## 2019-07-18 ENCOUNTER — Encounter: Payer: Self-pay | Admitting: Family

## 2019-07-18 ENCOUNTER — Ambulatory Visit (INDEPENDENT_AMBULATORY_CARE_PROVIDER_SITE_OTHER): Payer: 59 | Admitting: Family

## 2019-07-18 ENCOUNTER — Telehealth: Payer: Self-pay | Admitting: Cardiology

## 2019-07-18 VITALS — BP 153/90 | HR 69 | Temp 97.0°F | Ht 72.0 in | Wt 275.0 lb

## 2019-07-18 DIAGNOSIS — G4700 Insomnia, unspecified: Secondary | ICD-10-CM

## 2019-07-18 DIAGNOSIS — Z720 Tobacco use: Secondary | ICD-10-CM

## 2019-07-18 DIAGNOSIS — R079 Chest pain, unspecified: Secondary | ICD-10-CM

## 2019-07-18 DIAGNOSIS — K219 Gastro-esophageal reflux disease without esophagitis: Secondary | ICD-10-CM | POA: Diagnosis not present

## 2019-07-18 DIAGNOSIS — Z1211 Encounter for screening for malignant neoplasm of colon: Secondary | ICD-10-CM

## 2019-07-18 DIAGNOSIS — I10 Essential (primary) hypertension: Secondary | ICD-10-CM

## 2019-07-18 DIAGNOSIS — F411 Generalized anxiety disorder: Secondary | ICD-10-CM

## 2019-07-18 DIAGNOSIS — E1142 Type 2 diabetes mellitus with diabetic polyneuropathy: Secondary | ICD-10-CM

## 2019-07-18 DIAGNOSIS — E785 Hyperlipidemia, unspecified: Secondary | ICD-10-CM

## 2019-07-18 DIAGNOSIS — I25119 Atherosclerotic heart disease of native coronary artery with unspecified angina pectoris: Secondary | ICD-10-CM | POA: Diagnosis not present

## 2019-07-18 DIAGNOSIS — J449 Chronic obstructive pulmonary disease, unspecified: Secondary | ICD-10-CM

## 2019-07-18 DIAGNOSIS — Z8249 Family history of ischemic heart disease and other diseases of the circulatory system: Secondary | ICD-10-CM

## 2019-07-18 DIAGNOSIS — M069 Rheumatoid arthritis, unspecified: Secondary | ICD-10-CM

## 2019-07-18 DIAGNOSIS — N410 Acute prostatitis: Secondary | ICD-10-CM

## 2019-07-18 DIAGNOSIS — E119 Type 2 diabetes mellitus without complications: Secondary | ICD-10-CM

## 2019-07-18 DIAGNOSIS — F321 Major depressive disorder, single episode, moderate: Secondary | ICD-10-CM

## 2019-07-18 DIAGNOSIS — R350 Frequency of micturition: Secondary | ICD-10-CM

## 2019-07-18 DIAGNOSIS — I209 Angina pectoris, unspecified: Secondary | ICD-10-CM

## 2019-07-18 LAB — URINALYSIS, COMPLETE
Bilirubin, UA: NEGATIVE
Glucose, UA: NEGATIVE
Ketones, UA: NEGATIVE
Leukocytes,UA: NEGATIVE
Nitrite, UA: NEGATIVE
Protein,UA: NEGATIVE
RBC, UA: NEGATIVE
Specific Gravity, UA: 1.025 (ref 1.005–1.030)
Urobilinogen, Ur: 0.2 mg/dL (ref 0.2–1.0)
pH, UA: 6.5 (ref 5.0–7.5)

## 2019-07-18 LAB — CBC WITH DIFFERENTIAL/PLATELET
Basophils Absolute: 0.1 10*3/uL (ref 0.0–0.2)
Basos: 1 %
EOS (ABSOLUTE): 0.2 10*3/uL (ref 0.0–0.4)
Eos: 2 %
Hematocrit: 46.3 % (ref 37.5–51.0)
Hemoglobin: 16 g/dL (ref 13.0–17.7)
Immature Grans (Abs): 0 10*3/uL (ref 0.0–0.1)
Immature Granulocytes: 0 %
Lymphocytes Absolute: 1.5 10*3/uL (ref 0.7–3.1)
Lymphs: 15 %
MCH: 30.5 pg (ref 26.6–33.0)
MCHC: 34.6 g/dL (ref 31.5–35.7)
MCV: 88 fL (ref 79–97)
Monocytes Absolute: 0.5 10*3/uL (ref 0.1–0.9)
Monocytes: 5 %
Neutrophils Absolute: 7.8 10*3/uL — ABNORMAL HIGH (ref 1.4–7.0)
Neutrophils: 77 %
Platelets: 236 10*3/uL (ref 150–450)
RBC: 5.24 x10E6/uL (ref 4.14–5.80)
RDW: 12.8 % (ref 11.6–15.4)
WBC: 10.1 10*3/uL (ref 3.4–10.8)

## 2019-07-18 LAB — CMP14+EGFR
ALT: 15 IU/L (ref 0–44)
AST: 16 IU/L (ref 0–40)
Albumin/Globulin Ratio: 2 (ref 1.2–2.2)
Albumin: 4.5 g/dL (ref 3.8–4.9)
Alkaline Phosphatase: 91 IU/L (ref 39–117)
BUN/Creatinine Ratio: 12 (ref 9–20)
BUN: 12 mg/dL (ref 6–24)
Bilirubin Total: 0.5 mg/dL (ref 0.0–1.2)
CO2: 19 mmol/L — ABNORMAL LOW (ref 20–29)
Calcium: 9.4 mg/dL (ref 8.7–10.2)
Chloride: 101 mmol/L (ref 96–106)
Creatinine, Ser: 1.02 mg/dL (ref 0.76–1.27)
GFR calc Af Amer: 93 mL/min/{1.73_m2} (ref >59)
GFR calc non Af Amer: 80 mL/min/{1.73_m2} (ref >59)
Globulin, Total: 2.2 g/dL (ref 1.5–4.5)
Glucose: 142 mg/dL — ABNORMAL HIGH (ref 65–99)
Potassium: 4.3 mmol/L (ref 3.5–5.2)
Sodium: 139 mmol/L (ref 134–144)
Total Protein: 6.7 g/dL (ref 6.0–8.5)

## 2019-07-18 LAB — MICROSCOPIC EXAMINATION
Bacteria, UA: NONE SEEN
Epithelial Cells (non renal): NONE SEEN /hpf (ref 0–10)
RBC, Urine: NONE SEEN /hpf (ref 0–2)
Renal Epithel, UA: NONE SEEN /hpf
WBC, UA: NONE SEEN /hpf (ref 0–5)

## 2019-07-18 LAB — BAYER DCA HB A1C WAIVED: HB A1C (BAYER DCA - WAIVED): 6.1 % (ref <7.0)

## 2019-07-18 MED ORDER — ONDANSETRON 4 MG PO TBDP: 4.0000 mg | ORAL_TABLET | Freq: Three times a day (TID) | ORAL | 0 refills | Status: DC | PRN

## 2019-07-18 MED ORDER — ISOSORBIDE MONONITRATE ER 120 MG PO TB24: 120.0000 mg | ORAL_TABLET | ORAL | 3 refills | Status: AC

## 2019-07-18 MED ORDER — ONDANSETRON 4 MG PO TBDP: 4.0000 mg | ORAL_TABLET | Freq: Three times a day (TID) | ORAL | 3 refills | Status: AC | PRN

## 2019-07-18 MED ORDER — CHLORTHALIDONE 25 MG PO TABS: 25.0000 mg | ORAL_TABLET | Freq: Every day | ORAL | 3 refills | Status: AC

## 2019-07-18 MED ORDER — CIPROFLOXACIN HCL 500 MG PO TABS: 500.0000 mg | ORAL_TABLET | Freq: Two times a day (BID) | ORAL | 0 refills | Status: DC

## 2019-07-18 MED ORDER — AMLODIPINE BESYLATE 5 MG PO TABS: 5.0000 mg | ORAL_TABLET | Freq: Every day | ORAL | 1 refills | Status: AC

## 2019-07-18 NOTE — Progress Notes (Signed)
Subjective:    Patient ID: Cesar Morales, male    DOB: [birth year], 59 y.o.   MRN: [id number]  Chief Complaint  Patient presents with  . Medical Management of Chronic Issues  . Groin Pain    been going on4 mths   . Nausea    been going on for 3 weeks    Pt presents to the office today for chronic follow up. He states he went 3 months without his medications, because he could not afford it. He states he started a new job and has insurance now.   He has followed with Rheumatologists for RA. He is currently taking Methotrexate injection weekly and Humira.  States his pain is a 6 out 10 in his bilateral hands, feet, and right hip.  PT is followed by Cardiologists annually for CAD. Groin Pain Associated symptoms include frequency and nausea. Pertinent negatives include no chest pain, flank pain, headaches, shortness of breath or urgency.  Gastroesophageal Reflux He complains of belching, heartburn and nausea. He reports no chest pain. This is a chronic problem. The current episode started more than 1 year ago. The problem occurs occasionally. The problem has been waxing and waning. Risk factors include obesity. He has tried a PPI for the symptoms. The treatment provided moderate relief.  Hypertension This is a chronic problem. The current episode started more than 1 year ago. The problem has been waxing and waning since onset. The problem is uncontrolled. Associated symptoms include palpitations. Pertinent negatives include no chest pain, headaches, malaise/fatigue, peripheral edema or shortness of breath. Risk factors for coronary artery disease include dyslipidemia, obesity, male gender and sedentary lifestyle. The current treatment provides moderate improvement. There is no history of CAD/MI, CVA or heart failure.  Depression        This is a chronic problem.  The current episode started more than 1 year ago.   The onset quality is gradual.   The problem occurs intermittently.  Associated  symptoms include decreased concentration, insomnia, irritable, restlessness and sad.  Associated symptoms include no helplessness, no hopelessness and no headaches.  Past treatments include SSRIs - Selective serotonin reuptake inhibitors. Anxiety Presents for follow-up visit. Symptoms include decreased concentration, excessive worry, insomnia, irritability, nausea, palpitations and restlessness. Patient reports no chest pain or shortness of breath. Symptoms occur most days. The severity of symptoms is moderate.    Urinary Frequency  This is a new problem. The current episode started 1 to 4 weeks ago. The problem occurs intermittently. The problem has been unchanged. The pain is at a severity of 0/10. The patient is experiencing no pain. Associated symptoms include frequency, hematuria and nausea. Pertinent negatives include no discharge, flank pain or urgency. He has tried increased fluids for the symptoms. The treatment provided mild relief.  Hyperlipidemia This is a chronic problem. The current episode started more than 1 year ago. The problem is uncontrolled. Recent lipid tests were reviewed and are high. Pertinent negatives include no chest pain or shortness of breath. Current antihyperlipidemic treatment includes ezetimibe. The current treatment provides moderate improvement of lipids.  Insomnia Primary symptoms: difficulty falling asleep, frequent awakening, no malaise/fatigue.  The current episode started more than one year. The onset quality is gradual. The problem occurs intermittently. PMH includes: depression.  Diabetes He presents for his follow-up diabetic visit. He has type 2 diabetes mellitus. His disease course has been stable. There are no hypoglycemic associated symptoms. Pertinent negatives for hypoglycemia include no headaches. Associated symptoms include foot paresthesias. Pertinent  negatives for diabetes include no chest pain. There are no hypoglycemic complications. Symptoms are  stable. Diabetic complications include heart disease. Pertinent negatives for diabetic complications include no CVA. Risk factors for coronary artery disease include dyslipidemia, diabetes mellitus, male sex, hypertension and sedentary lifestyle. He is following a generally unhealthy diet. (Has not checked BS recently )  Chest Pain  This is a new problem. The current episode started 1 to 4 weeks ago. The onset quality is gradual. The problem occurs intermittently. The problem has been waxing and waning. The pain is present in the substernal region. The pain is at a severity of 2/10. The pain is mild. The quality of the pain is described as sharp. Radiates to: back. Associated symptoms include exertional chest pressure, nausea and palpitations. Pertinent negatives include no headaches, malaise/fatigue or shortness of breath. He has tried nitroglycerin and rest for the symptoms. The treatment provided mild relief.  His past medical history is significant for heart disease, hyperlipidemia and hypertension.    Neuropathy PT currently taking gabapentin 600 mg four times a day. He states he has constant aching pain in bilateral feet of 8 out 10. He states the gabapentin helps slightly.   Review of Systems  Constitutional: Positive for irritability. Negative for malaise/fatigue.  Respiratory: Negative for shortness of breath.   Cardiovascular: Positive for palpitations. Negative for chest pain.  Gastrointestinal: Positive for heartburn and nausea.  Genitourinary: Positive for frequency and hematuria. Negative for flank pain and urgency.  Neurological: Negative for headaches.  Psychiatric/Behavioral: Positive for decreased concentration and depression. The patient has insomnia.   All other systems reviewed and are negative.  Family History  Problem Relation Age of Onset  . Heart disease Mother 30       Died age 23  . Heart disease Father        Atrial fib  . Cancer Father    Social History    Socioeconomic History  . Marital status: Legally Separated    Spouse name: Not on file  . Number of children: Not on file  . Years of education: Not on file  . Highest education level: Not on file  Occupational History  . Not on file  Tobacco Use  . Smoking status: Current Every Day Smoker    Packs/day: 0.50  . Smokeless tobacco: Never Used  Substance and Sexual Activity  . Alcohol use: No  . Drug use: No  . Sexual activity: Not on file  Other Topics Concern  . Not on file  Social History Narrative  . Not on file   Social Determinants of Health   Financial Resource Strain:   . Difficulty of Paying Living Expenses: Not on file  Food Insecurity:   . Worried About Charity fundraiser in the Last Year: Not on file  . Ran Out of Food in the Last Year: Not on file  Transportation Needs:   . Lack of Transportation (Medical): Not on file  . Lack of Transportation (Non-Medical): Not on file  Physical Activity:   . Days of Exercise per Week: Not on file  . Minutes of Exercise per Session: Not on file  Stress:   . Feeling of Stress : Not on file  Social Connections:   . Frequency of Communication with Friends and Family: Not on file  . Frequency of Social Gatherings with Friends and Family: Not on file  . Attends Religious Services: Not on file  . Active Member of Clubs or Organizations: Not on file  .  Attends Archivist Meetings: Not on file  . Marital Status: Not on file       Objective:   Physical Exam Vitals reviewed.  Constitutional:      General: He is irritable. He is not in acute distress.    Appearance: He is well-developed.  HENT:     Head: Normocephalic.     Right Ear: External ear normal.     Left Ear: External ear normal.  Eyes:     General:        Right eye: No discharge.        Left eye: No discharge.     Pupils: Pupils are equal, round, and reactive to light.  Neck:     Thyroid: No thyromegaly.  Cardiovascular:     Rate and Rhythm:  Normal rate and regular rhythm.     Heart sounds: Normal heart sounds. No murmur.  Pulmonary:     Effort: Pulmonary effort is normal. No respiratory distress.     Breath sounds: Normal breath sounds. No wheezing.  Abdominal:     General: Bowel sounds are normal. There is no distension.     Palpations: Abdomen is soft.     Tenderness: There is no abdominal tenderness.  Musculoskeletal:        General: No tenderness.     Cervical back: Normal range of motion and neck supple.     Comments: Pain in bilateral hands with flexion and extension and hips  Skin:    General: Skin is warm and dry.     Findings: No erythema or rash.  Neurological:     Mental Status: He is alert and oriented to person, place, and time.     Cranial Nerves: No cranial nerve deficit.     Deep Tendon Reflexes: Reflexes are normal and symmetric.  Psychiatric:        Behavior: Behavior normal.        Thought Content: Thought content normal.        Judgment: Judgment normal.       BP (!) 153/90   Pulse 69   Temp (!) 97 F (36.1 C) (Temporal)   Ht 6' (1.829 m)   Wt 275 lb (124.7 kg)   SpO2 96%   BMI 37.30 kg/m      Assessment & Plan:  Conor Lata comes in today with chief complaint of Medical Management of Chronic Issues, Groin Pain (been going on4 mths ), and Nausea (been going on for 3 weeks )   Diagnosis and orders addressed:  1. Coronary artery disease involving native coronary artery of native heart with angina pectoris (HCC) - CMP14+EGFR - CBC with Differential/Platelet  2. Essential hypertension PT will restart his medications  -Dash diet information given -Exercise encouraged - Stress Management  - amLODipine (NORVASC) 5 MG tablet; Take 1 tablet (5 mg total) by mouth daily.  Dispense: 90 tablet; Refill: 1 - chlorthalidone (HYGROTON) 25 MG tablet; Take 1 tablet (25 mg total) by mouth daily.  Dispense: 90 tablet; Refill: 3 - isosorbide mononitrate (IMDUR) 120 MG 24 hr tablet; Take 1 tablet  (120 mg total) by mouth every morning.  Dispense: 90 tablet; Refill: 3 - CMP14+EGFR - CBC with Differential/Platelet  3. Chronic obstructive pulmonary disease, unspecified COPD type ([place]) - CMP14+EGFR - CBC with Differential/Platelet  4. Gastroesophageal reflux disease, unspecified whether esophagitis present - CMP14+EGFR - CBC with Differential/Platelet - Microalbumin / creatinine urine ratio  5. Diabetic peripheral neuropathy (HCC) - Bayer DCA Hb  A1c Waived - CMP14+EGFR - CBC with Differential/Platelet  6. Rheumatoid arthritis involving multiple sites, unspecified whether rheumatoid factor present ([place]) - CMP14+EGFR - CBC with Differential/Platelet  7. Depression, major, single episode, moderate (HCC) - CMP14+EGFR - CBC with Differential/Platelet  8. Dyslipidemia - CMP14+EGFR - CBC with Differential/Platelet  9. Family history of coronary artery disease - CMP14+EGFR - CBC with Differential/Platelet  10. GAD (generalized anxiety disorder) - CMP14+EGFR - CBC with Differential/Platelet  11. Insomnia, unspecified type - CMP14+EGFR - CBC with Differential/Platelet  12. Morbid obesity ([place]) - CMP14+EGFR - CBC with Differential/Platelet  13. Tobacco abuse - CMP14+EGFR - CBC with Differential/Platelet  14. Colon cancer screening - Cologuard - CMP14+EGFR - CBC with Differential/Platelet  15. Urinary frequency - Urinalysis, Complete - CMP14+EGFR - CBC with Differential/Platelet  16. Chest pain, unspecified type - EKG 12-Lead - Ambulatory referral to Cardiology - DG Chest 2 View; Future - CMP14+EGFR - CBC with Differential/Platelet  17. Acute prostatitis Force fluids - ciprofloxacin (CIPRO) 500 MG tablet; Take 1 tablet (500 mg total) by mouth 2 (two) times daily.  Dispense: 28 tablet; Refill: 0 - CMP14+EGFR - CBC with Differential/Platelet  18. Angina pectoris, unspecified ([place]) Referral to Cardiologists pending, want him to be seen in 1-2 days given  chest pain. Pt is stable today, and no pain right now.  - Ambulatory referral to Cardiology - CMP14+EGFR - CBC with Differential/Platelet  19. Non-insulin treated type 2 diabetes mellitus ([place]) - Bayer DCA Hb A1c Waived - CMP14+EGFR - CBC with Differential/Platelet - Microalbumin / creatinine urine ratio  Keep appointment with specialists  Labs pending Health Maintenance reviewed Diet and exercise encouraged  Follow up plan: 1 month  Evelina Dun, FNP

## 2019-07-18 NOTE — Telephone Encounter (Signed)
Prescription sent to pharmacy.

## 2019-07-18 NOTE — Telephone Encounter (Signed)
New Message    Pt is scheduled to see the PA on Thursday 07/21/19  Pt says his PCP wanted him to be seen urgently after having an EKG in their office and is wondering if he can be seen sooner in the office    Please call

## 2019-07-18 NOTE — Telephone Encounter (Signed)
Called pt about moving appt up, pt actually needed to move appt back. Moved appt back.

## 2019-07-18 NOTE — Patient Instructions (Signed)
Angina  Angina is extreme discomfort in the chest, neck, arm, jaw, or back. The discomfort is caused by a lack of blood in the middle layer of the heart wall (myocardium). There are four types of angina:  Stable angina. This is triggered by vigorous activity or exercise. It goes away when you rest or take angina medicine.  Unstable angina. This is a warning sign and can lead to a heart attack (acute coronary syndrome). This is a medical emergency. Symptoms come at rest and last a long time.  Microvascular angina. This affects the small coronary arteries. Symptoms include feeling tired and being short of breath.  Prinzmetal or variant angina. This is caused by a tightening (spasm) of the arteries that go to your heart. What are the causes? This condition is caused by atherosclerosis. This is the buildup of fat and cholesterol (plaque) in your arteries. The plaque may narrow or block the artery. Other causes of angina include:  Sudden tightening of the muscles of the arteries in the heart (coronary spasm).  Small artery disease (microvascular dysfunction).  Problems with any of your heart valves (heart valve disease).  A tear in an artery in your heart (coronary artery dissection).  Diseases of the heart muscle (cardiomyopathy), or other heart diseases. What increases the risk? You are more likely to develop this condition if you have:  High cholesterol.  High blood pressure (hypertension).  Diabetes.  A family history of heart disease.  An inactive (sedentary) lifestyle, or you do not exercise enough.  Depression.  Had radiation treatment to the left side of your chest. Other risk factors include:  Using tobacco.  Being obese.  Eating a diet high in saturated fats.  Being exposed to high stress or triggers of stress.  Using drugs, such as cocaine. Women have a greater risk for angina if:  They are older than 55.  They have gone through menopause (are  postmenopausal). What are the signs or symptoms? Common symptoms of this condition in both men and women may include:  Chest pain, which may: ? Feel like a crushing or squeezing in the chest, or like a tightness, pressure, fullness, or heaviness in the chest. ? Last for more than a few minutes at a time, or it may stop and come back (recur) over the course of a few minutes.  Pain in the neck, arm, jaw, or back.  Unexplained heartburn or indigestion.  Shortness of breath.  Nausea.  Sudden cold sweats. Women and people with diabetes may have unusual (atypical) symptoms, such as:  Fatigue.  Unexplained feelings of nervousness or anxiety.  Unexplained weakness.  Dizziness or fainting. How is this diagnosed? This condition may be diagnosed based on:  Your symptoms and medical history.  Electrocardiogram (ECG) to measure the electrical activity in your heart.  Blood tests.  Stress test to look for signs of blockage when your heart is stressed.  CT angiogram to examine your heart and the blood flow to it.  Coronary angiogram to check your coronary arteries for blockage. How is this treated? Angina may be treated with:  Medicines to: ? Prevent blood clots and heart attack. ? Relax blood vessels and improve blood flow to the heart (nitrates). ? Reduce blood pressure, improve the pumping action of the heart, and relax blood vessels that are spasming. ? Reduce cholesterol and help treat atherosclerosis.  A procedure to widen a narrowed or blocked coronary artery (angioplasty). A mesh tube may be placed in a coronary artery to   keep it open (coronary stenting).  Surgery to allow blood to go around a blocked artery (coronary artery bypass surgery). Follow these instructions at home: Medicines  Take over-the-counter and prescription medicines only as told by your health care provider.  Do not take the following medicines unless your health care provider approves: ? NSAIDs,  such as ibuprofen or naproxen. ? Vitamin supplements that contain vitamin A, vitamin E, or both. ? Hormone replacement therapy that contains estrogen with or without progestin. Eating and drinking   Eat a heart-healthy diet. This includes plenty of fresh fruits and vegetables, whole grains, low-fat (lean) protein, and low-fat dairy products.  Follow instructions from your health care provider about eating or drinking restrictions. Activity  Follow an exercise program approved by your health care provider.  Consider joining a cardiac rehabilitation program.  Take a break when you feel fatigued. Plan rest periods in your daily activities. Lifestyle   Do not use any products that contain nicotine or tobacco, such as cigarettes, e-cigarettes, and chewing tobacco. If you need help quitting, ask your health care provider.  If your health care provider says you can drink alcohol: ? Limit how much you use to:  0-1 drink a day for nonpregnant women.  0-2 drinks a day for men. ? Be aware of how much alcohol is in your drink. In the [place] one drink equals one 12 oz bottle of beer (355 mL), one 5 oz glass of wine (148 mL), or one 1 oz glass of hard liquor (44 mL). General instructions  Maintain a healthy weight.  Learn to manage stress.  Keep your vaccinations up to date. Get the flu (influenza) vaccine every year.  Talk to your health care provider if you feel depressed. Take a depression screening test to see if you are at risk for depression.  Work with your health care provider to manage other health conditions, such as hypertension or diabetes.  Keep all follow-up visits as told by your health care provider. This is important. Get help right away if:  You have pain in your chest, neck, arm, jaw, or back, and the pain: ? Lasts more than a few minutes. ? Is recurring. ? Is not relieved by taking medicines under the tongue (sublingual nitroglycerin). ? Increases in intensity or  frequency.  You have a lot of sweating without cause.  You have unexplained: ? Heartburn or indigestion. ? Shortness of breath or difficulty breathing. ? Nausea or vomiting. ? Fatigue. ? Feelings of nervousness or anxiety. ? Weakness.  You have sudden light-headedness or dizziness.  You faint. These symptoms may represent a serious problem that is an emergency. Do not wait to see if the symptoms will go away. Get medical help right away. Call your local emergency services (911 in the [place] Do not drive yourself to the hospital. Summary  Angina is extreme discomfort in the chest, neck, arm, jaw, or back that is caused by a lack of blood in the heart wall.  There are many symptoms of angina. They include chest pain, unexplained heartburn or indigestion, sudden cold sweats, and fatigue.  Angina may be treated with behavioral changes, medicine, or surgery.  Symptoms of angina may represent an emergency. Get medical help right away. Call your local emergency services (911 in the [place] Do not drive yourself to the hospital. This information is not intended to replace advice given to you by your health care provider. Make sure you discuss any questions you have with your health care provider.   Document Revised: 12/21/2017 Document Reviewed: 12/21/2017 Elsevier Patient Education  2020 [employer] Inc.  

## 2019-07-20 ENCOUNTER — Ambulatory Visit: Payer: Medicaid Other | Admitting: Adult Health

## 2019-07-21 ENCOUNTER — Other Ambulatory Visit: Payer: Self-pay | Admitting: Family

## 2019-07-21 ENCOUNTER — Ambulatory Visit: Payer: Medicaid Other | Admitting: Medical

## 2019-07-21 MED ORDER — FUROSEMIDE 20 MG PO TABS: 20.0000 mg | ORAL_TABLET | Freq: Every day | ORAL | 11 refills | Status: DC

## 2019-07-22 ENCOUNTER — Telehealth: Payer: Self-pay | Admitting: Family

## 2019-07-22 MED ORDER — FUROSEMIDE 20 MG PO TABS: 20.0000 mg | ORAL_TABLET | Freq: Every day | ORAL | 11 refills | Status: DC

## 2019-07-22 NOTE — Telephone Encounter (Signed)
Rx sent to [facility].  Patient aware

## 2019-08-12 ENCOUNTER — Other Ambulatory Visit: Payer: Self-pay

## 2019-08-12 ENCOUNTER — Ambulatory Visit: Payer: 59 | Admitting: Physician Assistant

## 2019-08-12 ENCOUNTER — Encounter: Payer: Self-pay | Admitting: Physician Assistant

## 2019-08-12 VITALS — BP 152/88 | HR 72 | Ht 72.0 in | Wt 278.4 lb

## 2019-08-12 DIAGNOSIS — J449 Chronic obstructive pulmonary disease, unspecified: Secondary | ICD-10-CM

## 2019-08-12 DIAGNOSIS — Z720 Tobacco use: Secondary | ICD-10-CM

## 2019-08-12 DIAGNOSIS — E119 Type 2 diabetes mellitus without complications: Secondary | ICD-10-CM

## 2019-08-12 DIAGNOSIS — I25118 Atherosclerotic heart disease of native coronary artery with other forms of angina pectoris: Secondary | ICD-10-CM | POA: Diagnosis not present

## 2019-08-12 DIAGNOSIS — E785 Hyperlipidemia, unspecified: Secondary | ICD-10-CM | POA: Diagnosis not present

## 2019-08-12 DIAGNOSIS — I10 Essential (primary) hypertension: Secondary | ICD-10-CM | POA: Diagnosis not present

## 2019-08-12 NOTE — Patient Instructions (Signed)
Medication Instructions:  Your physician recommends that you continue on your current medications as directed. Please refer to the Current Medication list given to you today.  *If you need a refill on your cardiac medications before your next appointment, please call your pharmacy*  Lab Work: NONE ordered at this time of appointment   If you have labs (blood work) drawn today and your tests are completely normal, you will receive your results only by: . MyChart Message (if you have MyChart) OR . A paper copy in the mail If you have any lab test that is abnormal or we need to change your treatment, we will call you to review the results.  Testing/Procedures: NONE ordered at this time of appointment   Follow-Up: At [facility], you and your health needs are our priority.  As part of our continuing mission to provide you with exceptional heart care, we have created designated Provider Care Teams.  These Care Teams include your primary Cardiologist (physician) and Advanced Practice Providers (APPs -  Physician Assistants and Nurse Practitioners) who all work together to provide you with the care you need, when you need it.  Your next appointment:   2 month(s)  The format for your next appointment:   In Person  Provider:   James Hochrein, MD  Other Instructions   

## 2019-08-12 NOTE — Progress Notes (Signed)
Cardiology Office Note:    Date:  08/15/2019   ID:  Cesar Morales, DOB [birth year], MRN [id number]  PCP:  Sharion Balloon, FNP  Cardiologist:  Minus Breeding, MD  Electrophysiologist:  None   Referring MD: Sharion Balloon, FNP   Chief Complaint  Patient presents with  . Follow-up    seen for Dr. Percival Spanish    History of Present Illness:    Cesar Morales is a 59 y.o. male with a hx of hypertension, hyperlipidemia, tobacco abuse, DM 2, COPD and CAD.  Previous cardiac catheterization in 2018 demonstrated 80% OM lesion with nonobstructive CAD otherwise.  OM lesion occurred in the ostial area and not a good PCI target.  He had 35% ostial RPDA lesion, 35% mid LAD lesion, 40% OM 2 lesion.  It was described that he had large patulous vessels.  Patient was previously admitted in August 2020 for chest pain.  He was evaluated by Dr. Oval Linsey who felt his chest pain was noncardiac.  Myoview performed on 02/04/2019 showed EF 47%, small defect of mild severity present in the apical inferior and apex location which represent small area of ischemia, overall it was interpreted as intermediate risk study.  The study was reviewed by Dr. Percival Spanish who recommended medical management.  He was initiated on Repatha in October 2020.  He was last seen by Bunnie Domino, NP on 03/09/2019.  At that time he had continued to have chronic dyspnea on exertion.  Belenda Cruise was thinking about testing him for obstructive sleep apnea, however he could not afford the testing.  He presents today for cardiology office visit.  More recently, he had episode of chest pain.  The chest pain is fairly atypical and occur at a focal spot on the right chest.  Chest x-ray report obtained by PCP on 07/18/2019 was suspicious for pulmonary edema versus atypical viral infection.  Patient was instructed to take 20 mg Lasix.  Since then, right-sided chest pain has completely resolved.  He still have chronic substernal central pain, however this has  been unchanged for the past several years.  He also continues to smoke at this time and I do feel he is breathing is worse with exercise.  I highly encouraged him to stop smoking.  Past Medical History:  Diagnosis Date  . COPD (chronic obstructive pulmonary disease) ([place])   . Coronary artery disease   . Diabetes mellitus without complication ([place])   . Dyslipidemia   . HTN (hypertension)   . Tobacco abuse     Past Surgical History:  Procedure Laterality Date  . CHOLECYSTECTOMY    . HERNIA REPAIR    . LEFT HEART CATH AND CORONARY ANGIOGRAPHY N/A 02/24/2017   Procedure: LEFT HEART CATH AND CORONARY ANGIOGRAPHY;  Surgeon: Leonie Man, MD;  Location: [facility];  Service: Cardiovascular;  Laterality: N/A;    Current Medications: Current Meds  Medication Sig  . Adalimumab (HUMIRA PEN) 40 MG/0.8ML PNKT Inject into the skin.  Marland Kitchen albuterol (PROVENTIL HFA;VENTOLIN HFA) 108 (90 Base) MCG/ACT inhaler Inhale 2 puffs into the lungs every 6 (six) hours as needed for wheezing or shortness of breath.  Marland Kitchen amLODipine (NORVASC) 5 MG tablet Take 1 tablet (5 mg total) by mouth daily.  Marland Kitchen aspirin EC 81 MG tablet Take 81 mg by mouth daily.  . bisacodyl (DULCOLAX) 5 MG EC tablet Take 5 mg by mouth as needed for moderate constipation.  . carvedilol (COREG) 6.25 MG tablet Take 1 tablet (6.25 mg total)  by mouth 2 (two) times daily with a meal.  . chlorthalidone (HYGROTON) 25 MG tablet Take 1 tablet (25 mg total) by mouth daily.  . folic acid (FOLVITE) 1 MG tablet Take 1 mg by mouth daily.  . furosemide (LASIX) 20 MG tablet Take 1 tablet (20 mg total) by mouth daily.  Marland Kitchen gabapentin (NEURONTIN) 600 MG tablet Take 1 tablet (600 mg total) by mouth 4 (four) times daily - after meals and at bedtime.  Marland Kitchen HYDROcodone-acetaminophen (NORCO/VICODIN) 5-325 MG tablet SMARTSIG:1 Tablet(s) By Mouth As Needed PRN  . isosorbide mononitrate (IMDUR) 120 MG 24 hr tablet Take 1 tablet (120 mg total) by mouth every morning.    . isosorbide mononitrate (IMDUR) 60 MG 24 hr tablet Take 1 tablet (60 mg total) by mouth every evening.  . metFORMIN (GLUCOPHAGE) 1000 MG tablet Take 1 tablet (1,000 mg total) by mouth 2 (two) times daily with a meal.  . methotrexate (RHEUMATREX) 2.5 MG tablet Take 15 mg by mouth once a week. Caution:Chemotherapy. Protect from light. Take on Sunday  . naproxen (NAPROSYN) 500 MG tablet Take 500 mg by mouth as needed.  . nitroGLYCERIN (NITROSTAT) 0.4 MG SL tablet Place 1 tablet (0.4 mg total) under the tongue every 5 (five) minutes as needed for chest pain.  Marland Kitchen ondansetron (ZOFRAN ODT) 4 MG disintegrating tablet Take 1 tablet (4 mg total) by mouth every 8 (eight) hours as needed for nausea or vomiting.  . pantoprazole (PROTONIX) 40 MG tablet Take 1 tablet (40 mg total) by mouth daily.  . prednisoLONE 5 MG TABS tablet Take 5 mg by mouth daily. Also take  An additional 5 mg as needed  . traZODone (DESYREL) 50 MG tablet Take 1-2 tablets (50-100 mg total) by mouth at bedtime as needed for sleep.     Allergies:   Lisinopril and Lipitor [atorvastatin]   Social History   Socioeconomic History  . Marital status: Legally Separated    Spouse name: Not on file  . Number of children: Not on file  . Years of education: Not on file  . Highest education level: Not on file  Occupational History  . Not on file  Tobacco Use  . Smoking status: Current Every Day Smoker    Packs/day: 0.50  . Smokeless tobacco: Never Used  Substance and Sexual Activity  . Alcohol use: No  . Drug use: No  . Sexual activity: Not on file  Other Topics Concern  . Not on file  Social History Narrative  . Not on file   Social Determinants of Health   Financial Resource Strain:   . Difficulty of Paying Living Expenses:   Food Insecurity:   . Worried About Programme researcher, broadcasting/film/video in the Last Year:   . Barista in the Last Year:   Transportation Needs:   . Freight forwarder (Medical):   Marland Kitchen Lack of Transportation  (Non-Medical):   Physical Activity:   . Days of Exercise per Week:   . Minutes of Exercise per Session:   Stress:   . Feeling of Stress :   Social Connections:   . Frequency of Communication with Friends and Family:   . Frequency of Social Gatherings with Friends and Family:   . Attends Religious Services:   . Active Member of Clubs or Organizations:   . Attends Banker Meetings:   Marland Kitchen Marital Status:      Family History: The patient's family history includes Cancer in his father; Heart disease  in his father; Heart disease (age of onset: 18) in his mother.  ROS:   Please see the history of present illness.     All other systems reviewed and are negative.  EKGs/Labs/Other Studies Reviewed:    The following studies were reviewed today:  Echo 01/10/2019 IMPRESSIONS    1. The left ventricle has low normal systolic function, with an ejection  fraction of 50-55%. The cavity size was moderately dilated. There is  mildly increased left ventricular wall thickness. Left ventricular  diastolic Doppler parameters are consistent  with impaired relaxation. No evidence of left ventricular regional wall  motion abnormalities.  2. The right ventricle has normal systolic function. The cavity was  normal. There is no increase in right ventricular wall thickness. Right  ventricular systolic pressure could not be assessed.  3. The aortic valve is tricuspid. Mild sclerosis of the aortic valve.  Aortic valve regurgitation is mild by color flow Doppler.  4. The aorta is normal unless otherwise noted.  5. The inferior vena cava was dilated in size with >50% respiratory  variability.    Myoview 02/04/2019  Nuclear stress EF: 47%. There is mild generalized hypokinesis.  The left ventricular ejection fraction is mildly decreased (45-54%).  There was no ST segment deviation noted during stress.  Defect 1: There is a small defect of mild severity present in the apical inferior  and apex location. This may represent a small area of apical ischemia.  This is an intermediate risk study. Ejection fraction mildly reduced, small area of possible inferoapical ischemia.   EKG:  EKG is ordered today.  The ekg ordered today demonstrates normal sinus rhythm, no significant ST-T wave changes  Recent Labs: 07/18/2019: ALT 15; BUN 12; Creatinine, Ser 1.02; Hemoglobin 16.0; Platelets 236; Potassium 4.3; Sodium 139  Recent Lipid Panel    Component Value Date/Time   CHOL 198 01/10/2019 0222   CHOL 197 08/25/2016 0908   TRIG 185 (H) 01/10/2019 0222   HDL 34 (L) 01/10/2019 0222   HDL 33 (L) 08/25/2016 0908   CHOLHDL 5.8 01/10/2019 0222   VLDL 37 01/10/2019 0222   LDLCALC 127 (H) 01/10/2019 0222   LDLCALC 109 (H) 08/25/2016 0908    Physical Exam:    VS:  BP (!) 152/88   Pulse 72   Ht 6' (1.829 m)   Wt 278 lb 6.4 oz (126.3 kg)   BMI 37.76 kg/m     Wt Readings from Last 3 Encounters:  08/12/19 278 lb 6.4 oz (126.3 kg)  07/18/19 275 lb (124.7 kg)  03/09/19 162 lb (73.5 kg)     GEN:  Well nourished, well developed in no acute distress HEENT: Normal NECK: No JVD; No carotid bruits LYMPHATICS: No lymphadenopathy CARDIAC: RRR, no murmurs, rubs, gallops RESPIRATORY:  Clear to auscultation without rales, wheezing or rhonchi  ABDOMEN: Soft, non-tender, non-distended MUSCULOSKELETAL:  No edema; No deformity  SKIN: Warm and dry NEUROLOGIC:  Alert and oriented x 3 PSYCHIATRIC:  Normal affect   ASSESSMENT:    1. Coronary artery disease involving native coronary artery of native heart with other form of angina pectoris (HCC)   2. Essential hypertension   3. Hyperlipidemia LDL goal <70   4. Tobacco abuse   5. Controlled type 2 diabetes mellitus without complication, without long-term current use of insulin (HCC)   6. Chronic obstructive pulmonary disease, unspecified COPD type (HCC)    PLAN:    In order of problems listed above:  1. CAD: He has chronic  substernal  chest pain.  Previous cardiac catheterization in October 2018 demonstrated subtotal occlusion of ostial OM1 not amenable to PCI due to ostial location.  Myoview in 2020 demonstrated small ischemia.  He has been having chronic substernal chest pain since.  Recently, he developed atypical right-sided chest pain.  However chest x-ray obtained by PCP demonstrated signs of volume overload.  He was prescribed to 20 mg daily of Lasix.  Since then right-sided chest pain has completely resolved.  He is due to return to see his PCP and obtain lab work.  At this time I wish to hold off on additional work-up unless symptom recurs.  2. Hypertension: Blood pressure controlled on the current therapy  3. Hyperlipidemia: On statin medication  4. DM 2: Managed per primary care provider  5. Tobacco abuse: He continues to smoke about 4 cigarettes a day, this has been significantly reduced when compared to the previous tobacco usage.  Despite so, I continue to recommend complete cessation from tobacco  6. COPD: He does complain of worsening dyspnea on exertion, I recommended tobacco cessation.   Medication Adjustments/Labs and Tests Ordered: Current medicines are reviewed at length with the patient today.  Concerns regarding medicines are outlined above.  Orders Placed This Encounter  Procedures  . EKG 12-Lead   No orders of the defined types were placed in this encounter.   Patient Instructions  Medication Instructions:  Your physician recommends that you continue on your current medications as directed. Please refer to the Current Medication list given to you today.  *If you need a refill on your cardiac medications before your next appointment, please call your pharmacy*  Lab Work: NONE ordered at this time of appointment   If you have labs (blood work) drawn today and your tests are completely normal, you will receive your results only by: Marland Kitchen MyChart Message (if you have MyChart) OR . A paper copy in  the mail If you have any lab test that is abnormal or we need to change your treatment, we will call you to review the results.  Testing/Procedures: NONE ordered at this time of appointment   Follow-Up: At [facility], you and your health needs are our priority.  As part of our continuing mission to provide you with exceptional heart care, we have created designated Provider Care Teams.  These Care Teams include your primary Cardiologist (physician) and Advanced Practice Providers (APPs -  Physician Assistants and Nurse Practitioners) who all work together to provide you with the care you need, when you need it.  Your next appointment:   2 month(s)  The format for your next appointment:   In Person  Provider:   Rollene Rotunda, MD  Other Instructions      Signed, Azalee Course, PA  08/15/2019 12:01 AM    [facility]

## 2019-08-14 ENCOUNTER — Encounter: Payer: Self-pay | Admitting: Physician Assistant

## 2019-08-18 ENCOUNTER — Ambulatory Visit: Payer: 59 | Admitting: Family

## 2019-08-31 ENCOUNTER — Telehealth: Payer: Self-pay | Admitting: Family

## 2019-08-31 NOTE — Telephone Encounter (Signed)
Patient states that he is having bloody urine and testicular pain.  Offered appt for 4/15 with Deliah Boston, FNP.   Patient declined. Advised patient that could also be evaluated at the urgent care or in the ER.  Patient declined this as well and states that he only wants to see Jannifer Rodney, FNP.  Informed patient if symptoms worsen before appt with Jannifer Rodney, FNP on 4/23 to contact this office or seek care in the urgent care or ER

## 2019-09-09 ENCOUNTER — Encounter: Payer: Self-pay | Admitting: Family

## 2019-09-09 ENCOUNTER — Other Ambulatory Visit: Payer: Self-pay

## 2019-09-09 ENCOUNTER — Ambulatory Visit (INDEPENDENT_AMBULATORY_CARE_PROVIDER_SITE_OTHER): Payer: 59 | Admitting: Family

## 2019-09-09 VITALS — BP 143/85 | HR 71 | Temp 97.5°F | Ht 72.0 in | Wt 274.0 lb

## 2019-09-09 DIAGNOSIS — Z87442 Personal history of urinary calculi: Secondary | ICD-10-CM

## 2019-09-09 DIAGNOSIS — I25118 Atherosclerotic heart disease of native coronary artery with other forms of angina pectoris: Secondary | ICD-10-CM

## 2019-09-09 DIAGNOSIS — R079 Chest pain, unspecified: Secondary | ICD-10-CM

## 2019-09-09 DIAGNOSIS — Z720 Tobacco use: Secondary | ICD-10-CM

## 2019-09-09 DIAGNOSIS — I10 Essential (primary) hypertension: Secondary | ICD-10-CM

## 2019-09-09 LAB — BMP8+EGFR
BUN/Creatinine Ratio: 12 (ref 9–20)
BUN: 13 mg/dL (ref 6–24)
CO2: 23 mmol/L (ref 20–29)
Calcium: 9.6 mg/dL (ref 8.7–10.2)
Chloride: 102 mmol/L (ref 96–106)
Creatinine, Ser: 1.09 mg/dL (ref 0.76–1.27)
GFR calc Af Amer: 85 mL/min/{1.73_m2} (ref >59)
GFR calc non Af Amer: 74 mL/min/{1.73_m2} (ref >59)
Glucose: 121 mg/dL — ABNORMAL HIGH (ref 65–99)
Potassium: 4.4 mmol/L (ref 3.5–5.2)
Sodium: 142 mmol/L (ref 134–144)

## 2019-09-09 NOTE — Addendum Note (Signed)
Addended by: Margorie John on: 09/09/2019 10:41 AM   Modules accepted: Orders

## 2019-09-09 NOTE — Progress Notes (Signed)
Subjective:    Patient ID: Cesar Morales, male    DOB: 10/11/1960, 59 y.o.   MRN: [id number]  Chief Complaint  Patient presents with  . Medical Management of Chronic Issues   Pt presents to the office today to recheck prostatititis, chest pain, and BP.   He saw his Cardiologists on 08/12/19 for follow up on chest pain. He has CAD and has had a previous cardiac cath in 02/2017. His chest x-ray on 07/18/19 showed volume overload. He was started on lasix 20 mg daily and reports this right sided chest pain has resolved since started the lasix.   He completed his cipro a couple of weeks ago. However, he reports he noticed blood in urine prior over the last week and then on 09/04/19 he went to the bathroom and notice a lot of blood and 9 out 10. Now after that all his symptoms have resolved. He does have a hx of kidney stones.  Hypertension This is a chronic problem. The current episode started more than 1 year ago. The problem has been waxing and waning since onset. The problem is uncontrolled. Associated symptoms include malaise/fatigue and peripheral edema. Pertinent negatives include no shortness of breath. Risk factors for coronary artery disease include diabetes mellitus, dyslipidemia, obesity, male gender and sedentary lifestyle. The current treatment provides moderate improvement. Hypertensive end-organ damage includes CAD/MI and heart failure.      Review of Systems  Constitutional: Positive for malaise/fatigue.  Respiratory: Negative for shortness of breath.   All other systems reviewed and are negative.      Objective:   Physical Exam Vitals reviewed.  Constitutional:      General: He is not in acute distress.    Appearance: He is well-developed.  HENT:     Head: Normocephalic.     Right Ear: Tympanic membrane normal.     Left Ear: Tympanic membrane normal.  Eyes:     General:        Right eye: No discharge.        Left eye: No discharge.     Pupils: Pupils are equal,  round, and reactive to light.  Neck:     Thyroid: No thyromegaly.  Cardiovascular:     Rate and Rhythm: Normal rate and regular rhythm.     Heart sounds: Normal heart sounds. No murmur.  Pulmonary:     Effort: Pulmonary effort is normal. No respiratory distress.     Breath sounds: Normal breath sounds. No wheezing.  Abdominal:     General: Bowel sounds are normal. There is no distension.     Palpations: Abdomen is soft.     Tenderness: There is no abdominal tenderness.  Musculoskeletal:        General: No tenderness.     Cervical back: Normal range of motion and neck supple.     Comments: Generalized joint pain with flexion  Skin:    General: Skin is warm and dry.     Findings: No erythema or rash.  Neurological:     Mental Status: He is alert and oriented to person, place, and time.     Cranial Nerves: No cranial nerve deficit.     Deep Tendon Reflexes: Reflexes are normal and symmetric.  Psychiatric:        Behavior: Behavior normal.        Thought Content: Thought content normal.        Judgment: Judgment normal.        BP (!) 143/85  Pulse 71   Temp (!) 97.5 F (36.4 C) (Temporal)   Ht 6' (1.829 m)   Wt 274 lb (124.3 kg)   SpO2 96%   BMI 37.16 kg/m   Assessment & Plan:  [name] comes in today with chief complaint of Medical Management of Chronic Issues   Diagnosis and orders addressed:  1. Coronary artery disease involving native coronary artery of native heart with other form of angina pectoris (HCC) - BMP8+EGFR  2. Essential hypertension - BMP8+EGFR  3. Chest pain, unspecified type - BMP8+EGFR  4. Tobacco abuse - BMP8+EGFR  5. Morbid obesity (HCC) - BMP8+EGFR   6. History of kidney stones   Labs pending Health Maintenance reviewed Diet and exercise encouraged  Follow up plan: 3 months    Evelina Dun, FNP

## 2019-09-09 NOTE — Patient Instructions (Signed)

## 2019-09-10 LAB — MICROALBUMIN / CREATININE URINE RATIO
Creatinine, Urine: 244.9 mg/dL
Microalb/Creat Ratio: 11 mg/g creat (ref 0–29)
Microalbumin, Urine: 27.8 ug/mL

## 2019-10-12 NOTE — Progress Notes (Deleted)
Cardiology Office Note   Date:  10/12/2019   ID:  [name], [name] August 05, 1960, MRN [id number]  PCP:  Junie Spencer, FNP  Cardiologist:   Rollene Rotunda, MD Referring:  ***  No chief complaint on file.     History of Present Illness: Cesar Morales is a 59 y.o. male male with a hx of hypertension, hyperlipidemia, tobacco abuse, DM 2, COPD and CAD.  Previous cardiac catheterization in 2018 demonstrated 80% OM lesion with nonobstructive CAD otherwise.  OM lesion occurred in the ostial area and not a good PCI target.  He had 35% ostial RPDA lesion, 35% mid LAD lesion, 40% OM 2 lesion.  It was described that he had large patulous vessels.  Patient was previously admitted in August 2020 for chest pain.  He was evaluated by Dr. Duke Salvia who felt his chest pain was noncardiac.  Myoview performed on 02/04/2019 showed EF 47%, small defect of mild severity present in the apical inferior and apex location which represent small area of ischemia, overall it was interpreted as intermediate risk study. We recommended medical management.  ***  *** The study was reviewed by Dr. Antoine Poche who recommended medical management.  He was initiated on Repatha in October 2020.  He was last seen by Bailey Mech, NP on 03/09/2019.  At that time he had continued to have chronic dyspnea on exertion.  Natalia Leatherwood was thinking about testing him for obstructive sleep apnea, however he could not afford the testing.  He presents today for cardiology office visit.  More recently, he had episode of chest pain.  The chest pain is fairly atypical and occur at a focal spot on the right chest.  Chest x-ray report obtained by PCP on 07/18/2019 was suspicious for pulmonary edema versus atypical viral infection.  Patient was instructed to take 20 mg Lasix.  Since then, right-sided chest pain has completely resolved.  He still have chronic substernal central pain, however this has been unchanged for the past several years.  He also  continues to smoke at this time and I do feel he is breathing is worse with exercise.  I highly encouraged him to stop smoking.   Past Medical History:  Diagnosis Date  . COPD (chronic obstructive pulmonary disease) (HCC)   . Coronary artery disease   . Diabetes mellitus without complication (HCC)   . Dyslipidemia   . HTN (hypertension)   . Tobacco abuse     Past Surgical History:  Procedure Laterality Date  . CHOLECYSTECTOMY    . HERNIA REPAIR    . LEFT HEART CATH AND CORONARY ANGIOGRAPHY N/A 02/24/2017   Procedure: LEFT HEART CATH AND CORONARY ANGIOGRAPHY;  Surgeon: Marykay Lex, MD;  Location: [facility] INVASIVE CV LAB;  Service: Cardiovascular;  Laterality: N/A;     Current Outpatient Medications  Medication Sig Dispense Refill  . Adalimumab (HUMIRA PEN) 40 MG/0.8ML PNKT Inject into the skin.    Marland Kitchen albuterol (PROVENTIL HFA;VENTOLIN HFA) 108 (90 Base) MCG/ACT inhaler Inhale 2 puffs into the lungs every 6 (six) hours as needed for wheezing or shortness of breath. 1 Inhaler 2  . amLODipine (NORVASC) 5 MG tablet Take 1 tablet (5 mg total) by mouth daily. 90 tablet 1  . aspirin EC 81 MG tablet Take 81 mg by mouth daily.    . bisacodyl (DULCOLAX) 5 MG EC tablet Take 5 mg by mouth as needed for moderate constipation.    . carvedilol (COREG) 6.25 MG tablet Take 1 tablet (6.25  mg total) by mouth 2 (two) times daily with a meal. 90 tablet 1  . chlorthalidone (HYGROTON) 25 MG tablet Take 1 tablet (25 mg total) by mouth daily. 90 tablet 3  . folic acid (FOLVITE) 1 MG tablet Take 1 mg by mouth daily.    . furosemide (LASIX) 20 MG tablet Take 1 tablet (20 mg total) by mouth daily. 30 tablet 11  . gabapentin (NEURONTIN) 600 MG tablet Take 1 tablet (600 mg total) by mouth 4 (four) times daily - after meals and at bedtime. 120 tablet 1  . HYDROcodone-acetaminophen (NORCO/VICODIN) 5-325 MG tablet SMARTSIG:1 Tablet(s) By Mouth As Needed PRN    . isosorbide mononitrate (IMDUR) 120 MG 24 hr tablet Take  1 tablet (120 mg total) by mouth every morning. 90 tablet 3  . isosorbide mononitrate (IMDUR) 60 MG 24 hr tablet Take 1 tablet (60 mg total) by mouth every evening. 90 tablet 3  . metFORMIN (GLUCOPHAGE) 1000 MG tablet Take 1 tablet (1,000 mg total) by mouth 2 (two) times daily with a meal. 180 tablet 3  . methotrexate (RHEUMATREX) 2.5 MG tablet Take 15 mg by mouth once a week. Caution:Chemotherapy. Protect from light. Take on Sunday    . naproxen (NAPROSYN) 500 MG tablet Take 500 mg by mouth as needed.    . nitroGLYCERIN (NITROSTAT) 0.4 MG SL tablet Place 1 tablet (0.4 mg total) under the tongue every 5 (five) minutes as needed for chest pain. 25 tablet 1  . ondansetron (ZOFRAN ODT) 4 MG disintegrating tablet Take 1 tablet (4 mg total) by mouth every 8 (eight) hours as needed for nausea or vomiting. 20 tablet 3  . pantoprazole (PROTONIX) 40 MG tablet Take 1 tablet (40 mg total) by mouth daily. (Patient not taking: Reported on 09/09/2019) 30 tablet 1  . prednisoLONE 5 MG TABS tablet Take 5 mg by mouth daily. Also take  An additional 5 mg as needed    . traZODone (DESYREL) 50 MG tablet Take 1-2 tablets (50-100 mg total) by mouth at bedtime as needed for sleep. 60 tablet 3   No current facility-administered medications for this visit.    Allergies:   Lisinopril, Lipitor [atorvastatin], and Statins    ROS:  Please see the history of present illness.   Otherwise, review of systems are positive for {NONE DEFAULTED:18576::"none"}.   All other systems are reviewed and negative.    PHYSICAL EXAM: VS:  There were no vitals taken for this visit. , BMI There is no height or weight on file to calculate BMI. GENERAL:  Well appearing NECK:  No jugular venous distention, waveform within normal limits, carotid upstroke brisk and symmetric, no bruits, no thyromegaly LUNGS:  Clear to auscultation bilaterally CHEST:  Unremarkable HEART:  PMI not displaced or sustained,S1 and S2 within normal limits, no S3, no  S4, no clicks, no rubs, *** murmurs ABD:  Flat, positive bowel sounds normal in frequency in pitch, no bruits, no rebound, no guarding, no midline pulsatile mass, no hepatomegaly, no splenomegaly EXT:  2 plus pulses throughout, no edema, no cyanosis no clubbing   ***GENERAL:  Well appearing HEENT:  Pupils equal round and reactive, fundi not visualized, oral mucosa unremarkable NECK:  No jugular venous distention, waveform within normal limits, carotid upstroke brisk and symmetric, no bruits, no thyromegaly LYMPHATICS:  No cervical, inguinal adenopathy LUNGS:  Clear to auscultation bilaterally BACK:  No CVA tenderness CHEST:  Unremarkable HEART:  PMI not displaced or sustained,S1 and S2 within normal limits, no S3,  no S4, no clicks, no rubs, *** murmurs ABD:  Flat, positive bowel sounds normal in frequency in pitch, no bruits, no rebound, no guarding, no midline pulsatile mass, no hepatomegaly, no splenomegaly EXT:  2 plus pulses throughout, no edema, no cyanosis no clubbing SKIN:  No rashes no nodules NEURO:  Cranial nerves II through XII grossly intact, motor grossly intact throughout PSYCH:  Cognitively intact, oriented to person place and time    EKG:  EKG {ACTION; IS/IS NTI:[id number]} ordered today. The ekg ordered today demonstrates ***   Recent Labs: 07/18/2019: ALT 15; Hemoglobin 16.0; Platelets 236 09/09/2019: BUN 13; Creatinine, Ser 1.09; Potassium 4.4; Sodium 142    Lipid Panel    Component Value Date/Time   CHOL 198 01/10/2019 0222   CHOL 197 08/25/2016 0908   TRIG 185 (H) 01/10/2019 0222   HDL 34 (L) 01/10/2019 0222   HDL 33 (L) 08/25/2016 0908   CHOLHDL 5.8 01/10/2019 0222   VLDL 37 01/10/2019 0222   LDLCALC 127 (H) 01/10/2019 0222   LDLCALC 109 (H) 08/25/2016 0908      Wt Readings from Last 3 Encounters:  09/09/19 274 lb (124.3 kg)  08/12/19 278 lb 6.4 oz (126.3 kg)  07/18/19 275 lb (124.7 kg)      Other studies Reviewed: Additional studies/ records that  were reviewed today include: ***. Review of the above records demonstrates:  Please see elsewhere in the note.  ***   ASSESSMENT AND PLAN:  *** CAD:  *** He has chronic substernal chest pain.  Previous cardiac catheterization in October 2018 demonstrated subtotal occlusion of ostial OM1 not amenable to PCI due to ostial location.  Myoview in 2020 demonstrated small ischemia.  He has been having chronic substernal chest pain since.  Recently, he developed atypical right-sided chest pain.  However chest x-ray obtained by PCP demonstrated signs of volume overload.  He was prescribed to 20 mg daily of Lasix.  Since then right-sided chest pain has completely resolved.  He is due to return to see his PCP and obtain lab work.  At this time I wish to hold off on additional work-up unless symptom recurs.  CAD:  ***  Hypertension: Blood pressure is ***  controlled on the current therapy  Hyperlipidemia:  *** n statin medication  DM 2:  ***   Managed per primary care provider  Tobacco abuse:  ***  He continues to smoke about 4 cigarettes a day, this has been significantly reduced when compared to the previous tobacco usage.  Despite so, I continue to recommend complete cessation from tobacco  COPD:  ***He does complain of worsening dyspnea on exertion, I recommended tobacco cessation.  COVID EDUCATION:  ***   Current medicines are reviewed at length with the patient today.  The patient {ACTIONS; HAS/DOES NOT HAVE:19233} concerns regarding medicines.  The following changes have been made:  {PLAN; NO CHANGE:13088:s}  Labs/ tests ordered today include: *** No orders of the defined types were placed in this encounter.    Disposition:   FU with ***    Signed, Rollene Rotunda, MD  10/12/2019 9:10 PM    [facility]

## 2019-10-13 ENCOUNTER — Ambulatory Visit: Payer: 59 | Admitting: Cardiology

## 2019-11-28 IMAGING — CT CT HEAR MORPH WITH CTA COR WITH SCORE WITH CA WITH CONTRAST AND
4 of 7 series · 8 of 20 positions shown, 9 images · IV contrast (APPLIED)
Comparison: CT of the chest of 01/09/2019 from [HOSPITAL] rocking him.
COMPARISON: CT of the chest of 01/09/2019 from [HOSPITAL] rocking him.

Addendum:
EXAM:
OVER-READ INTERPRETATION  CT CHEST

The following report is an over-read performed by radiologist Dr.
Koushal Rindhe [REDACTED] on 01/11/2019. This over-read
does not include interpretation of cardiac or coronary anatomy or
pathology. The coronary CTA interpretation by the cardiologist is
attached.
CLINICAL DATA: 58M with CAD, hyperlipidemia, and hypertension
admitted with chest pain.
Cardiac/Coronary  CT
TECHNIQUE: The patient was scanned on a [employer] Force scanner.

[Series 7: best diast 75 % · axial · 0.41mm/px · z∈[+1310,+1354]mm · 2 of 338 slices shown, 3 images]
[im 113/338  vessel]
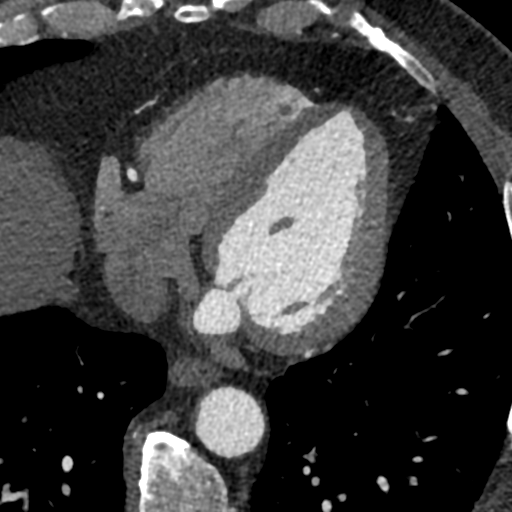
[im 113/338  lung]
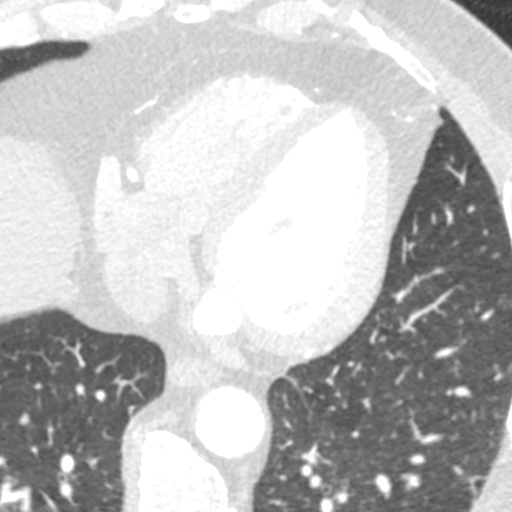
[im 225/338  vessel]
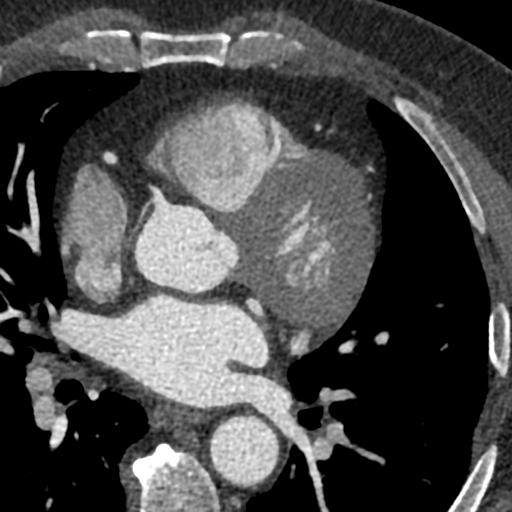

[Series 8: best syst 75 % · axial · 0.41mm/px · z∈[+1310,+1354]mm · 2 of 338 slices shown]
[im 113/338  vessel]
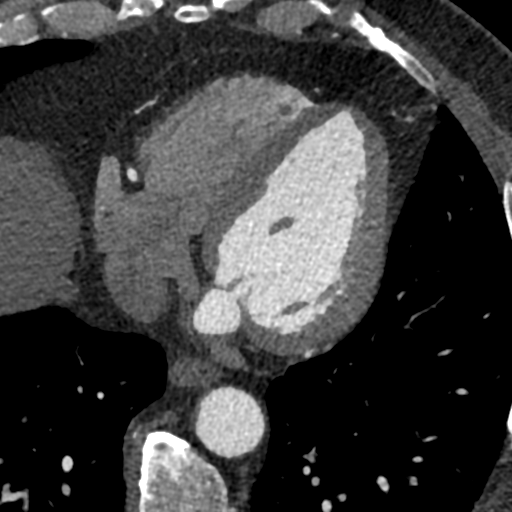
[im 225/338  vessel]
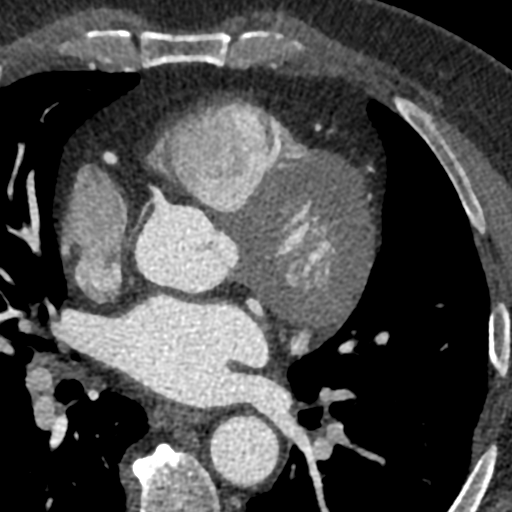

[Series 9: ts diast sharp 75 % · axial · 0.41mm/px · z∈[+1310,+1354]mm · 2 of 338 slices shown]
[im 113/338  lung]
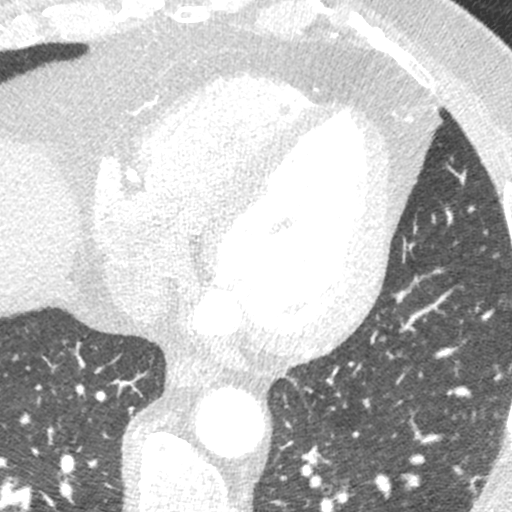
[im 225/338  lung]
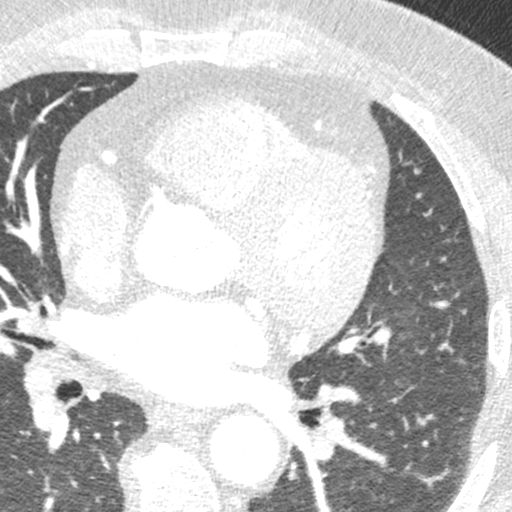

[Series 10: ts syst sharp 75 % · axial · 0.41mm/px · z∈[+1310,+1354]mm · 2 of 338 slices shown]
[im 113/338  lung]
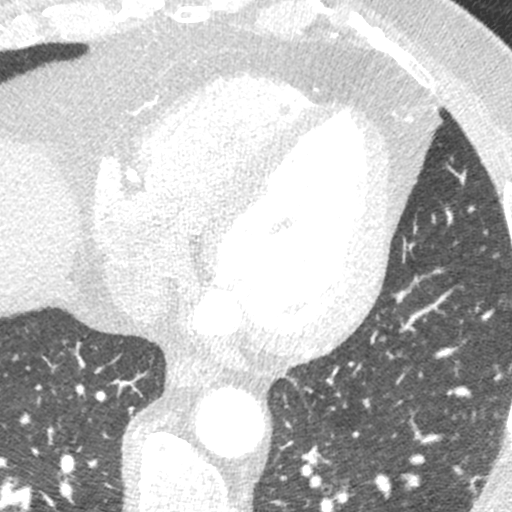
[im 225/338  lung]
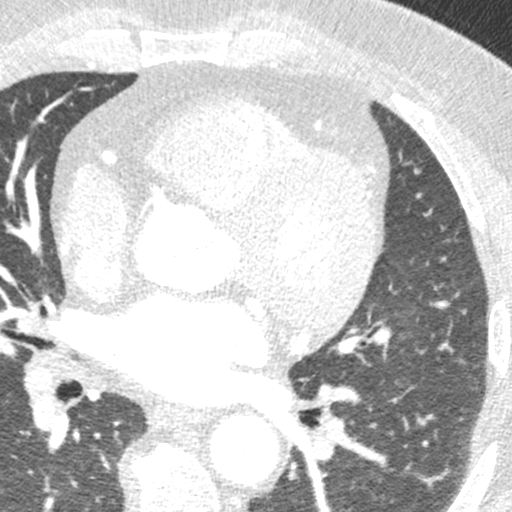

[8 of 20 positions shown; findings below may reference images not displayed]

FINDINGS: Vascular: Normal aortic caliber. Aortic atherosclerosis. No
dissection. No central pulmonary embolism, on this non-dedicated
study.

Mediastinum/Nodes: No imaged thoracic adenopathy.

Lungs/Pleura: No pleural fluid. Right lower lobe dependent
atelectasis. Previously described 3 mm right lower lobe pulmonary
nodule is again identified on image [DATE].

Upper Abdomen: Normal imaged portions of the liver, spleen, stomach.

Musculoskeletal: Moderate bilateral gynecomastia. Midthoracic
spondylosis.
IMPRESSION: 1.  No acute findings in the imaged extracardiac chest.
2. Right lower lobe pulmonary nodule, as on recent CTA chest. No
follow-up needed if patient is low-risk. Non-contrast chest CT can
be considered in 12 months if patient is high-risk. This
recommendation follows the consensus statement: Guidelines for
Management of Incidental Pulmonary Nodules Detected on CT Images:
3.  Aortic Atherosclerosis (ZCGGL-R3F.F).
4. Bilateral gynecomastia.
FINDINGS: A 120 kV prospective scan was triggered in the descending thoracic
aorta at 111 HU's. Axial non-contrast 3 mm slices were carried out
through the heart. The data set was analyzed on a dedicated work
station and scored using the Agatson method. Gantry rotation speed
was 250 msecs and collimation was .6 mm. No beta blockade and 0.8 mg
of sl NTG was given. The 3D data set was reconstructed in 5%
intervals of the 67-82 % of the R-R cycle. Diastolic phases were
analyzed on a dedicated work station using MPR, MIP and VRT modes.
The patient received 80 cc of contrast.

Aorta: Mild ascending aorta aneurysm. 3.7cm. Minimal calcification.
No dissection.

Aortic Valve:  Trileaflet.  No calcifications.

Coronary Arteries:  Normal coronary origin.  Right dominance.

RCA is a large dominant artery that gives rise to PDA and PLVB.
There is minimal, calcified plaque in the proximal RCA.

Left main is a large artery that gives rise to LAD and LCX arteries.

LAD is a large vessel. There is mild plaque in the proximal to mid
LAD. D1 is large with minimal soft attenuation plaque in the ostium.

LCX is a non-dominant artery that gives rise to a small OM1 branch
with severe calcified plaque in the ostium. This is a small vessel
not amenable to PCI. There is mild (25-49%) there is minimal (<25%)
calcified plaque in the mid LCx and minimal soft attenuation plaque
in the distal LCX.

Other findings:

Normal pulmonary vein drainage into the left atrium.

Normal let atrial appendage without a thrombus.

Normal size of the pulmonary artery.
IMPRESSION: 1. Coronary calcium score of 108. This was 74th percentile for age
and sex matched control.

2. Normal coronary origin with right dominance.

3. There is obstructive disease in OM1 that is not amenable to PCI
due to small vessel size.

4. Findings unchanged from cardiac catheterization in 0285.

5. Recommend aggressive risk factor modification including high
potency statin and aspirin.

*** End of Addendum ***
EXAM:
OVER-READ INTERPRETATION  CT CHEST

The following report is an over-read performed by radiologist Dr.
Koushal Rindhe [REDACTED] on 01/11/2019. This over-read
does not include interpretation of cardiac or coronary anatomy or
pathology. The coronary CTA interpretation by the cardiologist is
attached.
FINDINGS: Vascular: Normal aortic caliber. Aortic atherosclerosis. No
dissection. No central pulmonary embolism, on this non-dedicated
study.

Mediastinum/Nodes: No imaged thoracic adenopathy.

Lungs/Pleura: No pleural fluid. Right lower lobe dependent
atelectasis. Previously described 3 mm right lower lobe pulmonary
nodule is again identified on image [DATE].

Upper Abdomen: Normal imaged portions of the liver, spleen, stomach.

Musculoskeletal: Moderate bilateral gynecomastia. Midthoracic
spondylosis.
IMPRESSION: 1.  No acute findings in the imaged extracardiac chest.
2. Right lower lobe pulmonary nodule, as on recent CTA chest. No
follow-up needed if patient is low-risk. Non-contrast chest CT can
be considered in 12 months if patient is high-risk. This
recommendation follows the consensus statement: Guidelines for
Management of Incidental Pulmonary Nodules Detected on CT Images:
3.  Aortic Atherosclerosis (ZCGGL-R3F.F).
4. Bilateral gynecomastia.

## 2019-12-12 ENCOUNTER — Telehealth: Payer: Self-pay | Admitting: Family

## 2019-12-12 DIAGNOSIS — E1142 Type 2 diabetes mellitus with diabetic polyneuropathy: Secondary | ICD-10-CM

## 2019-12-12 MED ORDER — CARVEDILOL 6.25 MG PO TABS: 6.2500 mg | ORAL_TABLET | Freq: Two times a day (BID) | ORAL | 1 refills | Status: AC

## 2019-12-12 MED ORDER — CARVEDILOL 6.25 MG PO TABS: 6.2500 mg | ORAL_TABLET | Freq: Two times a day (BID) | ORAL | 1 refills | Status: DC

## 2019-12-12 MED ORDER — FUROSEMIDE 20 MG PO TABS: 20.0000 mg | ORAL_TABLET | Freq: Every day | ORAL | 1 refills | Status: AC

## 2019-12-12 MED ORDER — METFORMIN HCL 1000 MG PO TABS: 1000.0000 mg | ORAL_TABLET | Freq: Two times a day (BID) | ORAL | 1 refills | Status: AC

## 2019-12-12 MED ORDER — PANTOPRAZOLE SODIUM 40 MG PO TBEC: 40.0000 mg | DELAYED_RELEASE_TABLET | Freq: Every day | ORAL | 1 refills | Status: AC

## 2019-12-12 NOTE — Telephone Encounter (Signed)
Pt is out of town working for the next 60 days and he needed rx sent to [facility]. Pt did rescheduled with Jannifer Rodney for 02/16/20 at 8:25 and 30 day supply with 1 refill sent to the local pharmacy.

## 2019-12-12 NOTE — Telephone Encounter (Signed)
  Prescription Request  12/12/2019  What is the name of the medication or equipment? carvedilol (COREG) 6.25 MG tablet  dexlansoprazole (DEXILANT) 60 MG capsule  furosemide (LASIX) 20 MG tablet  metFORMIN (GLUCOPHAGE) 1000 MG tablet   Have you contacted your pharmacy to request a refill? (if applicable)No, pt is out of town and will be using another pharmacy, he had to cancel appt with Neysa Bonito tomorrow due to this and will be out of meds    Which pharmacy would you like this sent to? [facility] [place]   Patient notified that their request is being sent to the clinical staff for review and that they should receive a response within 2 business days.

## 2019-12-12 NOTE — Addendum Note (Signed)
Addended by: Hessie Diener on: 12/12/2019 12:24 PM   Modules accepted: Orders

## 2019-12-13 ENCOUNTER — Ambulatory Visit: Payer: 59 | Admitting: Family

## 2020-02-16 ENCOUNTER — Encounter: Payer: Self-pay | Admitting: Family

## 2020-02-16 ENCOUNTER — Ambulatory Visit: Payer: Medicaid Other | Admitting: Family

## 2020-06-03 IMAGING — DX DG CHEST 2V
3 series · 3 of 3 positions shown · non-contrast
Comparison: 01/09/2019

CLINICAL DATA: Right-sided chest pain, shortness of breath

EXAM:
CHEST - 2 VIEW

[chest pa]
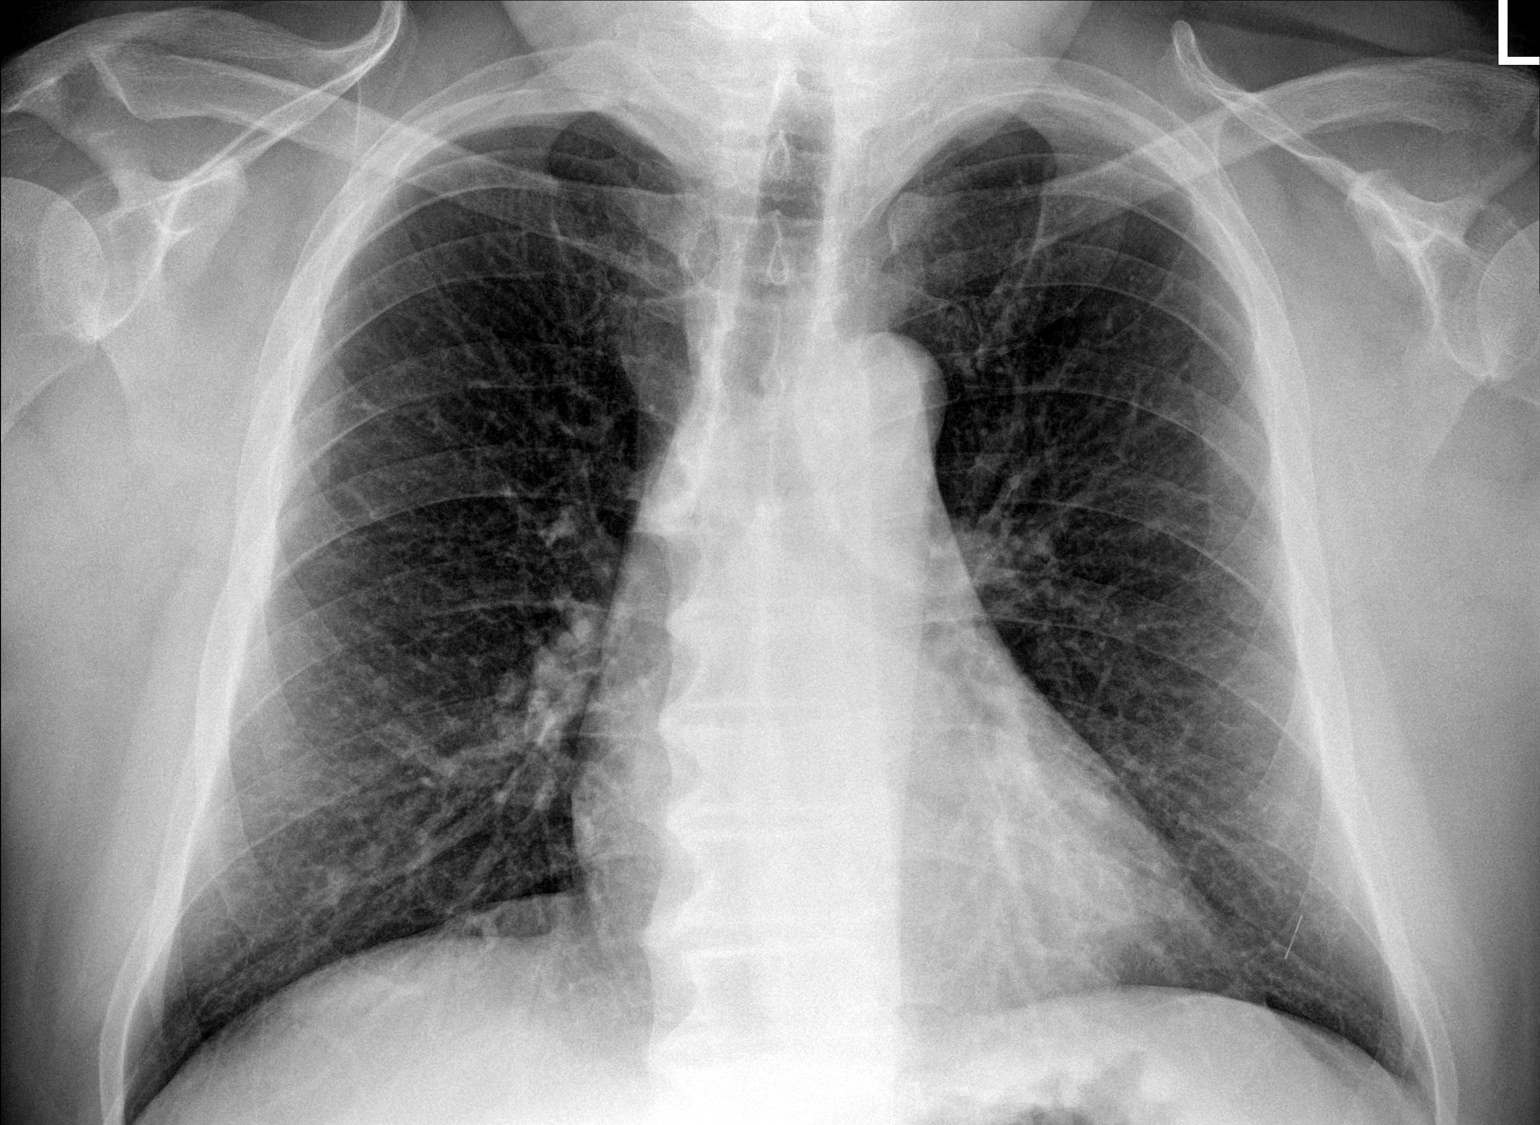

[chest lat (1 of 2)]
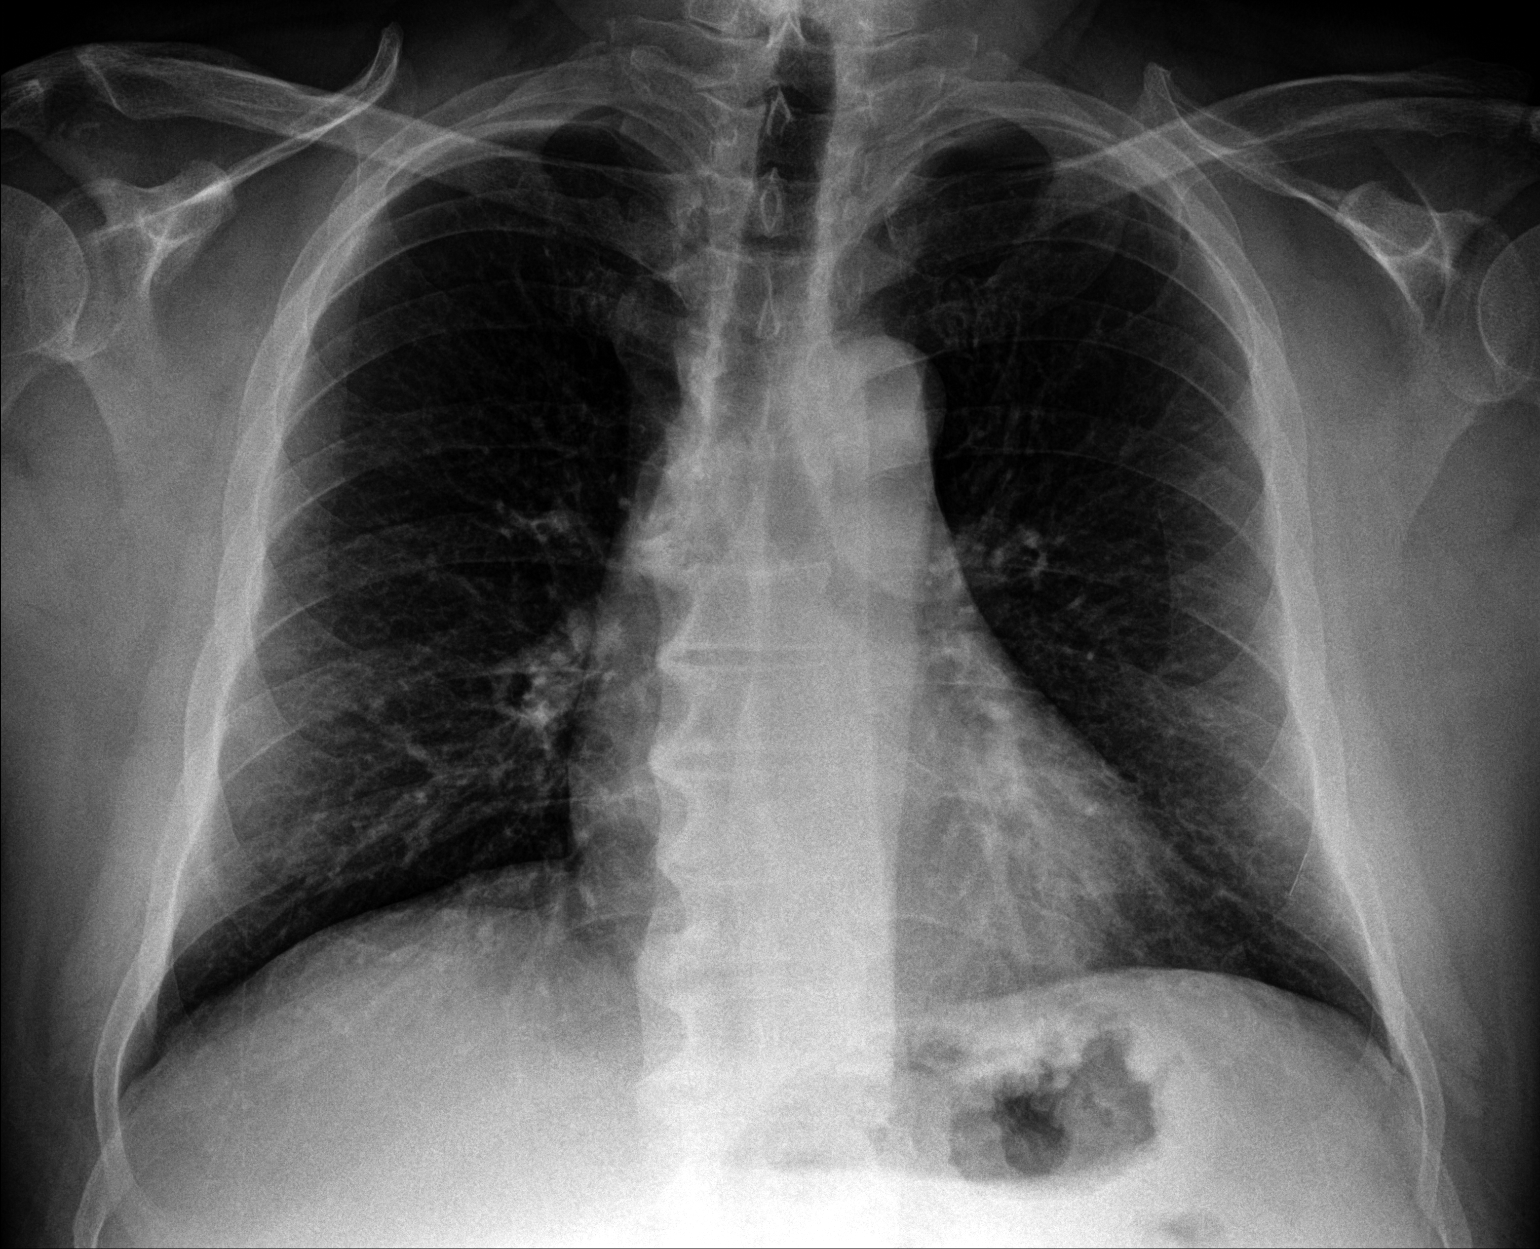

[chest lat (2 of 2)]
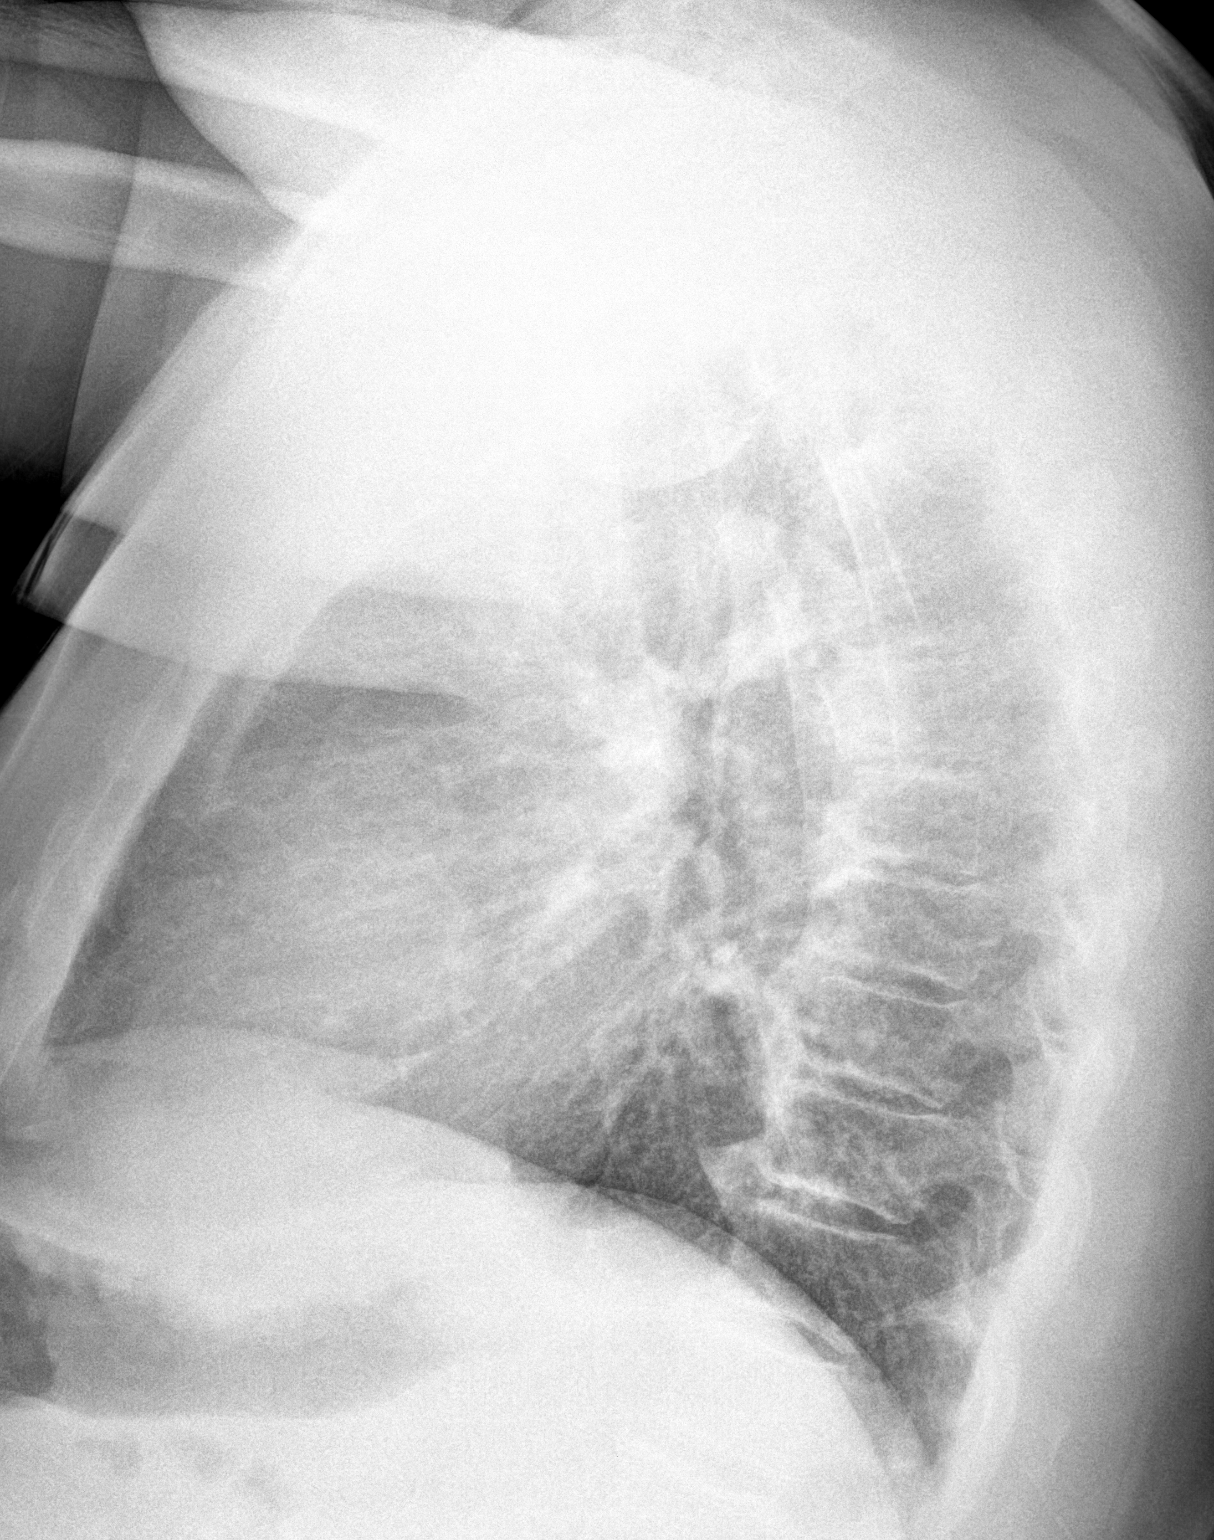

[3 of 3 positions shown; findings below may reference images not displayed]

FINDINGS: Mild cardiomegaly. Mild, diffuse interstitial pulmonary opacity.
Disc degenerative disease of the thoracic spine.
IMPRESSION: Mild cardiomegaly with mild, diffuse interstitial pulmonary opacity,
which may reflect edema or atypical/viral infection. No focal
airspace opacity.

## 2021-05-16 ENCOUNTER — Other Ambulatory Visit: Payer: Self-pay

## 2021-05-16 DIAGNOSIS — E785 Hyperlipidemia, unspecified: Secondary | ICD-10-CM

## 2021-05-16 DIAGNOSIS — G720 Drug-induced myopathy: Secondary | ICD-10-CM

## 2021-07-19 DIAGNOSIS — M1990 Unspecified osteoarthritis, unspecified site: Secondary | ICD-10-CM | POA: Diagnosis not present

## 2021-07-19 DIAGNOSIS — R69 Illness, unspecified: Secondary | ICD-10-CM | POA: Diagnosis not present

## 2021-07-19 DIAGNOSIS — R319 Hematuria, unspecified: Secondary | ICD-10-CM | POA: Diagnosis not present

## 2021-07-19 DIAGNOSIS — R1030 Lower abdominal pain, unspecified: Secondary | ICD-10-CM | POA: Diagnosis not present

## 2021-07-19 DIAGNOSIS — Z6832 Body mass index (BMI) 32.0-32.9, adult: Secondary | ICD-10-CM | POA: Diagnosis not present

## 2021-07-19 DIAGNOSIS — G4700 Insomnia, unspecified: Secondary | ICD-10-CM | POA: Diagnosis not present

## 2021-07-19 DIAGNOSIS — E119 Type 2 diabetes mellitus without complications: Secondary | ICD-10-CM | POA: Diagnosis not present

## 2021-07-19 DIAGNOSIS — Z87442 Personal history of urinary calculi: Secondary | ICD-10-CM | POA: Diagnosis not present

## 2021-07-22 DIAGNOSIS — M47817 Spondylosis without myelopathy or radiculopathy, lumbosacral region: Secondary | ICD-10-CM | POA: Diagnosis not present

## 2021-07-22 DIAGNOSIS — N200 Calculus of kidney: Secondary | ICD-10-CM | POA: Diagnosis not present

## 2021-07-22 DIAGNOSIS — N5089 Other specified disorders of the male genital organs: Secondary | ICD-10-CM | POA: Diagnosis not present

## 2021-07-22 DIAGNOSIS — R1030 Lower abdominal pain, unspecified: Secondary | ICD-10-CM | POA: Diagnosis not present

## 2021-07-25 DIAGNOSIS — R69 Illness, unspecified: Secondary | ICD-10-CM | POA: Diagnosis not present

## 2021-12-09 DIAGNOSIS — J189 Pneumonia, unspecified organism: Secondary | ICD-10-CM | POA: Diagnosis not present

## 2022-04-07 DIAGNOSIS — G4700 Insomnia, unspecified: Secondary | ICD-10-CM | POA: Diagnosis not present

## 2022-04-07 DIAGNOSIS — Z760 Encounter for issue of repeat prescription: Secondary | ICD-10-CM | POA: Diagnosis not present

## 2022-04-07 DIAGNOSIS — I159 Secondary hypertension, unspecified: Secondary | ICD-10-CM | POA: Diagnosis not present

## 2022-04-07 DIAGNOSIS — Z23 Encounter for immunization: Secondary | ICD-10-CM | POA: Diagnosis not present

## 2022-08-16 DIAGNOSIS — I10 Essential (primary) hypertension: Secondary | ICD-10-CM | POA: Diagnosis not present

## 2022-08-16 DIAGNOSIS — M1990 Unspecified osteoarthritis, unspecified site: Secondary | ICD-10-CM | POA: Diagnosis not present

## 2022-08-16 DIAGNOSIS — I2510 Atherosclerotic heart disease of native coronary artery without angina pectoris: Secondary | ICD-10-CM | POA: Diagnosis not present

## 2022-08-16 DIAGNOSIS — Z7982 Long term (current) use of aspirin: Secondary | ICD-10-CM | POA: Diagnosis not present

## 2022-08-16 DIAGNOSIS — Z809 Family history of malignant neoplasm, unspecified: Secondary | ICD-10-CM | POA: Diagnosis not present

## 2022-08-16 DIAGNOSIS — E119 Type 2 diabetes mellitus without complications: Secondary | ICD-10-CM | POA: Diagnosis not present

## 2022-08-16 DIAGNOSIS — K5900 Constipation, unspecified: Secondary | ICD-10-CM | POA: Diagnosis not present

## 2022-08-16 DIAGNOSIS — Z87891 Personal history of nicotine dependence: Secondary | ICD-10-CM | POA: Diagnosis not present

## 2022-08-16 DIAGNOSIS — E669 Obesity, unspecified: Secondary | ICD-10-CM | POA: Diagnosis not present

## 2022-08-16 DIAGNOSIS — Z8249 Family history of ischemic heart disease and other diseases of the circulatory system: Secondary | ICD-10-CM | POA: Diagnosis not present

## 2022-08-16 DIAGNOSIS — N529 Male erectile dysfunction, unspecified: Secondary | ICD-10-CM | POA: Diagnosis not present

## 2022-08-16 DIAGNOSIS — K219 Gastro-esophageal reflux disease without esophagitis: Secondary | ICD-10-CM | POA: Diagnosis not present

## 2022-09-01 DIAGNOSIS — Z6832 Body mass index (BMI) 32.0-32.9, adult: Secondary | ICD-10-CM | POA: Diagnosis not present

## 2022-09-01 DIAGNOSIS — Z760 Encounter for issue of repeat prescription: Secondary | ICD-10-CM | POA: Diagnosis not present

## 2022-09-01 DIAGNOSIS — G4700 Insomnia, unspecified: Secondary | ICD-10-CM | POA: Diagnosis not present

## 2023-01-23 DIAGNOSIS — Z91030 Bee allergy status: Secondary | ICD-10-CM | POA: Diagnosis not present
